# Patient Record
Sex: Male | Born: 1950 | Race: Black or African American | Hispanic: No | Marital: Married | Smoking: Current every day smoker
Health system: Southern US, Community
[De-identification: ages and names within clinical notes are randomized; demographics above are authoritative.]

## PROBLEM LIST (undated history)

## (undated) DIAGNOSIS — I10 Essential (primary) hypertension: Secondary | ICD-10-CM

## (undated) HISTORY — PX: BACK SURGERY: SHX140

---

## 2008-01-30 ENCOUNTER — Ambulatory Visit: Payer: Self-pay | Admitting: Unknown Physician Specialty

## 2008-07-23 ENCOUNTER — Emergency Department: Payer: Self-pay | Admitting: Emergency Medicine

## 2008-08-19 ENCOUNTER — Ambulatory Visit: Payer: Self-pay | Admitting: Unknown Physician Specialty

## 2008-09-17 ENCOUNTER — Ambulatory Visit: Payer: Self-pay | Admitting: Unknown Physician Specialty

## 2008-09-21 ENCOUNTER — Inpatient Hospital Stay: Payer: Self-pay | Admitting: Unknown Physician Specialty

## 2010-09-09 ENCOUNTER — Emergency Department: Payer: Self-pay | Admitting: Emergency Medicine

## 2014-02-22 DIAGNOSIS — M48061 Spinal stenosis, lumbar region without neurogenic claudication: Secondary | ICD-10-CM | POA: Insufficient documentation

## 2014-05-01 ENCOUNTER — Emergency Department: Payer: Self-pay | Admitting: Emergency Medicine

## 2014-05-22 ENCOUNTER — Emergency Department: Payer: Self-pay | Admitting: Emergency Medicine

## 2014-05-22 LAB — CBC
HCT: 45.8 % (ref 40.0–52.0)
HGB: 14.7 g/dL (ref 13.0–18.0)
MCH: 28.1 pg (ref 26.0–34.0)
MCHC: 32 g/dL (ref 32.0–36.0)
MCV: 88 fL (ref 80–100)
Platelet: 134 10*3/uL — ABNORMAL LOW (ref 150–440)
RBC: 5.21 10*6/uL (ref 4.40–5.90)
RDW: 12.8 % (ref 11.5–14.5)
WBC: 5.8 10*3/uL (ref 3.8–10.6)

## 2014-05-22 LAB — BASIC METABOLIC PANEL
ANION GAP: 9 (ref 7–16)
BUN: 12 mg/dL (ref 7–18)
Calcium, Total: 8.1 mg/dL — ABNORMAL LOW (ref 8.5–10.1)
Chloride: 105 mmol/L (ref 98–107)
Co2: 27 mmol/L (ref 21–32)
Creatinine: 1.1 mg/dL (ref 0.60–1.30)
EGFR (African American): >60
EGFR (Non-African Amer.): >60
Glucose: 90 mg/dL (ref 65–99)
Osmolality: 281 (ref 275–301)
POTASSIUM: 3.5 mmol/L (ref 3.5–5.1)
Sodium: 141 mmol/L (ref 136–145)

## 2014-05-22 LAB — TROPONIN I

## 2014-08-23 DIAGNOSIS — H6122 Impacted cerumen, left ear: Secondary | ICD-10-CM | POA: Diagnosis not present

## 2014-08-23 DIAGNOSIS — I10 Essential (primary) hypertension: Secondary | ICD-10-CM | POA: Diagnosis not present

## 2014-08-23 DIAGNOSIS — M4806 Spinal stenosis, lumbar region: Secondary | ICD-10-CM | POA: Diagnosis not present

## 2014-11-03 ENCOUNTER — Other Ambulatory Visit: Payer: Self-pay

## 2014-11-03 DIAGNOSIS — M4716 Other spondylosis with myelopathy, lumbar region: Secondary | ICD-10-CM | POA: Diagnosis not present

## 2014-11-03 DIAGNOSIS — M170 Bilateral primary osteoarthritis of knee: Secondary | ICD-10-CM | POA: Diagnosis not present

## 2014-11-03 NOTE — Patient Outreach (Signed)
[facility]) Care Management  11/03/2014  Nathan James 1950/12/03 [id number]   RN CM Spoke patient about the services of [facility]  Patient states he has a history of high blood pressure.  Patient states he takes his medications as prescribed.  States he see his primary doctor every 6 months. Patient kept his appointment in January with his primary doctor and no changes were made in his medication routine.  States when he picks up his medication each month at the drugstore he has his blood pressure taken.  Patient states he needed help with affording to see a dentist.  States he has some benefits but still needs assistance with affording dental care.  Patient decline the services of [facility].  RN CM will send resource information for residents of [place] care.  Patient has no further nursing needs and RN CM will close this case.  Maury Dus, RN, Ishmael Holter, [place] Telephonic Care Coordinator [phone]

## 2014-11-09 NOTE — Patient Outreach (Signed)
[name]) Care Management  11/09/2014  Nathan James 1950/10/06 [id number]   Received notification from Maury Dus, RN to close case due to patient refused to participate.  Case closed at this time.  Ronnell Freshwater. [place] CM Assistant Phone: [phone] Fax: [phone]

## 2015-02-18 DIAGNOSIS — M4806 Spinal stenosis, lumbar region: Secondary | ICD-10-CM | POA: Diagnosis not present

## 2015-02-18 DIAGNOSIS — Z125 Encounter for screening for malignant neoplasm of prostate: Secondary | ICD-10-CM | POA: Diagnosis not present

## 2015-02-18 DIAGNOSIS — I10 Essential (primary) hypertension: Secondary | ICD-10-CM | POA: Diagnosis not present

## 2015-02-25 DIAGNOSIS — Z0000 Encounter for general adult medical examination without abnormal findings: Secondary | ICD-10-CM | POA: Diagnosis not present

## 2015-02-25 DIAGNOSIS — M4806 Spinal stenosis, lumbar region: Secondary | ICD-10-CM | POA: Diagnosis not present

## 2015-11-02 DIAGNOSIS — M4806 Spinal stenosis, lumbar region: Secondary | ICD-10-CM | POA: Diagnosis not present

## 2015-11-02 DIAGNOSIS — I10 Essential (primary) hypertension: Secondary | ICD-10-CM | POA: Diagnosis not present

## 2016-04-17 ENCOUNTER — Emergency Department
Admission: EM | Admit: 2016-04-17 | Discharge: 2016-04-17 | Disposition: A | Discharge status: Home / Self Care | Payer: Commercial Managed Care - HMO | Attending: Emergency Medicine | Admitting: Emergency Medicine

## 2016-04-17 DIAGNOSIS — H6122 Impacted cerumen, left ear: Secondary | ICD-10-CM | POA: Diagnosis not present

## 2016-04-17 DIAGNOSIS — H938X2 Other specified disorders of left ear: Secondary | ICD-10-CM | POA: Diagnosis present

## 2016-04-17 NOTE — ED Triage Notes (Signed)
Patient ambulatory to triage with steady gait, without difficulty or distress noted; pt reports left ear "stopped up" for last couple weeks; denies pain, denies any recent illness

## 2016-04-17 NOTE — ED Notes (Signed)
Pt stated his left ear feels like cotton is stopped up in it and feels more he is off balance.

## 2016-04-17 NOTE — ED Notes (Signed)
Discharge instructions reviewed with patient. Questions fielded by this RN. Patient verbalizes understanding of instructions. Patient discharged home in stable condition per Bacon PA. No acute distress noted at time of discharge.   

## 2016-04-17 NOTE — ED Provider Notes (Signed)
[facility] Emergency Department Provider Note ____________________________________________  Time seen: 2251  I have reviewed the triage vital signs and the nursing notes.  HISTORY  Chief Complaint  Ear Fullness  HPI Nathan James is a 65 y.o. male sensitive the ED with left ear fullness that lasted days. Patient denies any injury, accident, or trauma. He denies any fevers, chills, sweats or upper extremity symptoms. He also reports some sense of dizziness but denies any ear drainage, vertigo, ringing in the ears, or hearing loss.  No past medical history on file.  There are no active problems to display for this patient.  No past surgical history on file.  Prior to Admission medications   Not on File   Allergies Review of patient's allergies indicates no known allergies.  No family history on file.  Social History Social History  Substance Use Topics  . Smoking status: Not on file  . Smokeless tobacco: Not on file  . Alcohol use Not on file   Review of Systems  Constitutional: Negative for fever. Eyes: Negative for visual changes. ENT: Negative for sore throat.Left ear fullness as above. Cardiovascular: Negative for chest pain. Respiratory: Negative for shortness of breath. Neurological: Negative for headaches, focal weakness or numbness. ____________________________________________  PHYSICAL EXAM:  VITAL SIGNS: ED Triage Vitals [04/17/16 2149]  Enc Vitals Group     BP (!) 157/82     Pulse Rate 78     Resp 18     Temp 98.2 F (36.8 C)     Temp Source Oral     SpO2 94 %     Weight 180 lb (81.6 kg)     Height 6' (1.829 m)     Head Circumference      Peak Flow      Pain Score      Pain Loc      Pain Edu?      Excl. in [place]?    Constitutional: Alert and oriented. Well appearing and in no distress. Head: Normocephalic and atraumatic.      Eyes: Conjunctivae are normal. PERRL. Normal extraocular movements      Ears: Left TM  completely obscurred by a large, soft plug of cerumen in the canal. Right TM intact and canal clear.  Neurologic:  Normal gait without ataxia. Normal speech and language. No gross focal neurologic deficits are appreciated. Skin:  Skin is warm, dry and intact. No rash noted. ____________________________________________  CERUMEN REMOVAL Performed by: Melvenia Needles Authorized by: Melvenia Needles Consent: Verbal consent obtained. Risks and benefits: risks, benefits and alternatives were discussed Consent given by: parent/patient Patient identity confirmed: provided demographic data Prepped and Draped in normal fashion  Ear(s) Involved: left  Irrigation Method: 18G IV cannula + 20 cc syringe  Irrigation Solution: 1:1 mixture of Hydrogen peroxide:water  Resolution: Successful cerumen impaction removal. TM(s) visualized and intact.  Patient tolerance: Patient tolerated the procedure well with no immediate complications. ____________________________________________  INITIAL IMPRESSION / ASSESSMENT AND PLAN / ED COURSE  Patient status post ear wash procedure for a left cerumen impaction. Discharged with instructions to use OTC Debrox as needed.   Clinical Course   ____________________________________________  FINAL CLINICAL IMPRESSION(S) / ED DIAGNOSES  Final diagnoses:  Cerumen impaction, left      Melvenia Needles, PA-C 04/17/16 2336    Nena Polio, MD 04/18/16 0006

## 2016-04-25 DIAGNOSIS — I10 Essential (primary) hypertension: Secondary | ICD-10-CM | POA: Diagnosis not present

## 2016-04-25 DIAGNOSIS — M4806 Spinal stenosis, lumbar region: Secondary | ICD-10-CM | POA: Diagnosis not present

## 2016-04-25 DIAGNOSIS — Z125 Encounter for screening for malignant neoplasm of prostate: Secondary | ICD-10-CM | POA: Diagnosis not present

## 2016-05-02 DIAGNOSIS — Z79899 Other long term (current) drug therapy: Secondary | ICD-10-CM | POA: Diagnosis not present

## 2016-05-02 DIAGNOSIS — Z0000 Encounter for general adult medical examination without abnormal findings: Secondary | ICD-10-CM | POA: Diagnosis not present

## 2016-10-24 DIAGNOSIS — Z79899 Other long term (current) drug therapy: Secondary | ICD-10-CM | POA: Diagnosis not present

## 2016-10-24 DIAGNOSIS — Z0000 Encounter for general adult medical examination without abnormal findings: Secondary | ICD-10-CM | POA: Diagnosis not present

## 2016-10-24 DIAGNOSIS — I10 Essential (primary) hypertension: Secondary | ICD-10-CM | POA: Diagnosis not present

## 2016-10-31 DIAGNOSIS — Z0000 Encounter for general adult medical examination without abnormal findings: Secondary | ICD-10-CM | POA: Insufficient documentation

## 2016-10-31 DIAGNOSIS — I10 Essential (primary) hypertension: Secondary | ICD-10-CM | POA: Insufficient documentation

## 2016-10-31 DIAGNOSIS — M48062 Spinal stenosis, lumbar region with neurogenic claudication: Secondary | ICD-10-CM | POA: Diagnosis not present

## 2017-04-30 DIAGNOSIS — Z79899 Other long term (current) drug therapy: Secondary | ICD-10-CM | POA: Diagnosis not present

## 2017-04-30 DIAGNOSIS — Z125 Encounter for screening for malignant neoplasm of prostate: Secondary | ICD-10-CM | POA: Diagnosis not present

## 2017-04-30 DIAGNOSIS — M48062 Spinal stenosis, lumbar region with neurogenic claudication: Secondary | ICD-10-CM | POA: Diagnosis not present

## 2017-05-07 DIAGNOSIS — M48062 Spinal stenosis, lumbar region with neurogenic claudication: Secondary | ICD-10-CM | POA: Diagnosis not present

## 2017-05-07 DIAGNOSIS — Z720 Tobacco use: Secondary | ICD-10-CM | POA: Insufficient documentation

## 2017-05-07 DIAGNOSIS — Z0000 Encounter for general adult medical examination without abnormal findings: Secondary | ICD-10-CM | POA: Diagnosis not present

## 2017-05-07 DIAGNOSIS — R972 Elevated prostate specific antigen [PSA]: Secondary | ICD-10-CM | POA: Diagnosis not present

## 2017-08-09 DIAGNOSIS — H524 Presbyopia: Secondary | ICD-10-CM | POA: Diagnosis not present

## 2017-08-09 DIAGNOSIS — H25813 Combined forms of age-related cataract, bilateral: Secondary | ICD-10-CM | POA: Diagnosis not present

## 2017-10-30 DIAGNOSIS — R972 Elevated prostate specific antigen [PSA]: Secondary | ICD-10-CM | POA: Diagnosis not present

## 2017-10-30 DIAGNOSIS — M48062 Spinal stenosis, lumbar region with neurogenic claudication: Secondary | ICD-10-CM | POA: Diagnosis not present

## 2017-11-06 DIAGNOSIS — R972 Elevated prostate specific antigen [PSA]: Secondary | ICD-10-CM | POA: Insufficient documentation

## 2017-11-06 DIAGNOSIS — Z0000 Encounter for general adult medical examination without abnormal findings: Secondary | ICD-10-CM | POA: Diagnosis not present

## 2017-11-06 DIAGNOSIS — Z125 Encounter for screening for malignant neoplasm of prostate: Secondary | ICD-10-CM | POA: Diagnosis not present

## 2017-11-06 DIAGNOSIS — M48062 Spinal stenosis, lumbar region with neurogenic claudication: Secondary | ICD-10-CM | POA: Diagnosis not present

## 2017-11-06 DIAGNOSIS — Z79899 Other long term (current) drug therapy: Secondary | ICD-10-CM | POA: Diagnosis not present

## 2017-12-21 ENCOUNTER — Encounter: Payer: Self-pay | Admitting: Emergency Medicine

## 2017-12-21 ENCOUNTER — Emergency Department: Payer: Medicare HMO

## 2017-12-21 ENCOUNTER — Other Ambulatory Visit: Payer: Self-pay

## 2017-12-21 ENCOUNTER — Emergency Department
Admission: EM | Admit: 2017-12-21 | Discharge: 2017-12-21 | Disposition: A | Discharge status: Home / Self Care | Payer: Medicare HMO | Attending: Emergency Medicine | Admitting: Emergency Medicine

## 2017-12-21 DIAGNOSIS — F17200 Nicotine dependence, unspecified, uncomplicated: Secondary | ICD-10-CM | POA: Diagnosis not present

## 2017-12-21 DIAGNOSIS — R05 Cough: Secondary | ICD-10-CM | POA: Insufficient documentation

## 2017-12-21 DIAGNOSIS — I10 Essential (primary) hypertension: Secondary | ICD-10-CM | POA: Insufficient documentation

## 2017-12-21 DIAGNOSIS — J209 Acute bronchitis, unspecified: Secondary | ICD-10-CM | POA: Insufficient documentation

## 2017-12-21 DIAGNOSIS — R0602 Shortness of breath: Secondary | ICD-10-CM | POA: Diagnosis not present

## 2017-12-21 HISTORY — DX: Essential (primary) hypertension: I10

## 2017-12-21 LAB — BASIC METABOLIC PANEL
Anion gap: 9 (ref 5–15)
BUN: 13 mg/dL (ref 6–20)
CALCIUM: 8.8 mg/dL — AB (ref 8.9–10.3)
CO2: 24 mmol/L (ref 22–32)
CREATININE: 1.1 mg/dL (ref 0.61–1.24)
Chloride: 103 mmol/L (ref 101–111)
GFR calc non Af Amer: >60 mL/min (ref >60)
GLUCOSE: 144 mg/dL — AB (ref 65–99)
Potassium: 3.1 mmol/L — ABNORMAL LOW (ref 3.5–5.1)
Sodium: 136 mmol/L (ref 135–145)

## 2017-12-21 LAB — CBC
HCT: 42.2 % (ref 40.0–52.0)
Hemoglobin: 14.2 g/dL (ref 13.0–18.0)
MCH: 28.3 pg (ref 26.0–34.0)
MCHC: 33.6 g/dL (ref 32.0–36.0)
MCV: 84.2 fL (ref 80.0–100.0)
Platelets: 128 10*3/uL — ABNORMAL LOW (ref 150–440)
RBC: 5.01 MIL/uL (ref 4.40–5.90)
RDW: 13.5 % (ref 11.5–14.5)
WBC: 5.3 10*3/uL (ref 3.8–10.6)

## 2017-12-21 LAB — TROPONIN I: Troponin I: <0.03 ng/mL (ref <0.03)

## 2017-12-21 MED ORDER — IPRATROPIUM-ALBUTEROL 0.5-2.5 (3) MG/3ML IN SOLN
3.0000 mL | Freq: Once | RESPIRATORY_TRACT | Status: AC
  Administered 2017-12-21: 3 mL via RESPIRATORY_TRACT
  Filled 2017-12-21: qty 3

## 2017-12-21 MED ORDER — PREDNISONE 20 MG PO TABS: 60.0000 mg | ORAL_TABLET | Freq: Every day | ORAL | 0 refills | Status: AC

## 2017-12-21 MED ORDER — PREDNISONE 20 MG PO TABS
60.0000 mg | ORAL_TABLET | Freq: Once | ORAL | Status: AC
  Administered 2017-12-21: 60 mg via ORAL
  Filled 2017-12-21: qty 3

## 2017-12-21 MED ORDER — ALBUTEROL SULFATE HFA 108 (90 BASE) MCG/ACT IN AERS: 2.0000 | INHALATION_SPRAY | Freq: Four times a day (QID) | RESPIRATORY_TRACT | 0 refills | Status: DC | PRN

## 2017-12-21 NOTE — Discharge Instructions (Addendum)
Take the prednisone as prescribed starting tomorrow, and continue it for 4 days.  You should use the albuterol inhaler every 4-6 hours for the next several days as needed.    Return to the ER for new, worsening, persistent severe difficulty breathing, chest pain, weakness or lightheadedness, fevers, or any other new or worsening symptoms that concern you.

## 2017-12-21 NOTE — ED Notes (Signed)
Patient in no apparent distress and is sitting on the side of the bed eating a biscuit.

## 2017-12-21 NOTE — ED Provider Notes (Signed)
[facility] Emergency Department Provider Note ____________________________________________   First MD Initiated Contact with Patient 12/21/17 1754     (approximate)  I have reviewed the triage vital signs and the nursing notes.   HISTORY  Chief Complaint Shortness of Breath    HPI Nathan James is a 67 y.o. male with PMH as noted below who presents with cough over the last 4 days, persistent coarse, nonproductive, and associated with mild shortness of breath.  No associated fever or chest pain.  Patient states that the symptoms began after he was working in his yard and was using an insect spray without wearing a mask.  He denies previous history of lung problems.  Past Medical History:  Diagnosis Date  . Hypertension     There are no active problems to display for this patient.   Past Surgical History:  Procedure Laterality Date  . BACK SURGERY      Prior to Admission medications   Medication Sig Start Date End Date Taking? Authorizing Provider  albuterol (PROVENTIL HFA;VENTOLIN HFA) 108 (90 Base) MCG/ACT inhaler Inhale 2 puffs into the lungs every 6 (six) hours as needed for up to 5 days for wheezing or shortness of breath. 12/21/17 12/26/17  Arta Silence, MD  predniSONE (DELTASONE) 20 MG tablet Take 3 tablets (60 mg total) by mouth daily with breakfast for 4 days. 12/22/17 12/26/17  Arta Silence, MD    Allergies Patient has no known allergies.  History reviewed. No pertinent family history.  Social History Social History   Tobacco Use  . Smoking status: Current Every Day Smoker  . Smokeless tobacco: Never Used  Substance Use Topics  . Alcohol use: Yes  . Drug use: Never    Review of Systems  Constitutional: No fever. Eyes: No redness. ENT: No sore throat. Cardiovascular: Denies chest pain. Respiratory: Positive for shortness of breath. Gastrointestinal: No vomiting.  No diarrhea.  Genitourinary: Negative for dysuria.   Musculoskeletal: Negative for back pain. Skin: Negative for rash. Neurological: Negative for headache.   ____________________________________________   PHYSICAL EXAM:  VITAL SIGNS: ED Triage Vitals  Enc Vitals Group     BP 12/21/17 1515 (!) 147/100     Pulse Rate 12/21/17 1515 71     Resp 12/21/17 1515 16     Temp 12/21/17 1515 98.1 F (36.7 C)     Temp Source 12/21/17 1515 Oral     SpO2 12/21/17 1515 95 %     Weight 12/21/17 1751 182 lb (82.6 kg)     Height 12/21/17 1751 6' (1.829 m)     Head Circumference --      Peak Flow --      Pain Score 12/21/17 1514 0     Pain Loc --      Pain Edu? --      Excl. in [place]? --     Constitutional: Alert and oriented. Well appearing and in no acute distress. Eyes: Conjunctivae are normal.  Head: Atraumatic. Nose: No congestion/rhinnorhea. Mouth/Throat: Mucous membranes are moist.   Neck: Normal range of motion.  Cardiovascular: Normal rate, regular rhythm. Grossly normal heart sounds.  Good peripheral circulation. Respiratory: Normal respiratory effort.  No retractions. Lungs CTAB.  Slightly decreased breath sounds and prolonged expiratory phase. Gastrointestinal: No distention.  Musculoskeletal: No lower extremity edema.  Extremities warm and well perfused.  Neurologic:  Normal speech and language. No gross focal neurologic deficits are appreciated.  Skin:  Skin is warm and dry. No rash noted.  Psychiatric: Mood and affect are normal. Speech and behavior are normal.  ____________________________________________   LABS (all labs ordered are listed, but only abnormal results are displayed)  Labs Reviewed  BASIC METABOLIC PANEL - Abnormal; Notable for the following components:      Result Value   Potassium 3.1 (*)    Glucose, Bld 144 (*)    Calcium 8.8 (*)    All other components within normal limits  CBC - Abnormal; Notable for the following components:   Platelets 128 (*)    All other components within normal limits    TROPONIN I   ____________________________________________  EKG  ED ECG REPORT I, Arta Silence, the attending physician, personally viewed and interpreted this ECG.  Date: 12/21/2017 EKG Time: 1517 Rate: 65 Rhythm: normal sinus rhythm QRS Axis: normal Intervals: Incomplete RBBB ST/T Wave abnormalities: Nonspecific anterior abnormalities Narrative Interpretation: no evidence of acute ischemia; no significant change when compared to EKG of 05/22/2014  ____________________________________________  RADIOLOGY  CXR: No focal infiltrate or other acute findings  ____________________________________________   PROCEDURES  Procedure(s) performed: No  Procedures  Critical Care performed: No ____________________________________________   INITIAL IMPRESSION / ASSESSMENT AND PLAN / ED COURSE  Pertinent labs & imaging results that were available during my care of the patient were reviewed by me and considered in my medical decision making (see chart for details).  67 year old male with history of hypertension presents with cough and shortness of breath over the last several days after spraying his yard without using a mask.  No prior history of similar symptoms, no sick contacts.  On exam, the patient is well-appearing, vital signs are normal except for hypertension, and the O2 saturation is in the mid to high 90s on room air.  No respiratory distress.  Differential includes acute bronchitis, likely viral, versus mild bronchitis/pneumonitis related to irritation from the insect spray.  Chest x-ray shows no infiltrate or findings consistent with pneumonia.  Lab work-up is unremarkable.  Plan: Trial of nebs, steroid, and reassess.  ----------------------------------------- 7:56 PM on 12/21/2017 -----------------------------------------  Patient reports improved symptoms after nebulizer treatment.  He feels well and would like to go home.  No indication for repeat troponin  given that he has had no chest pain or acute EKG changes.  He is stable for discharge home.  We will give prescription for prednisone and albuterol inhaler.  I discussed the results of the work-up with the patient and his wife.  Return precautions given, and he expresses understanding.      ____________________________________________   FINAL CLINICAL IMPRESSION(S) / ED DIAGNOSES  Final diagnoses:  Acute bronchitis, unspecified organism      NEW MEDICATIONS STARTED DURING THIS VISIT:  Discharge Medication List as of 12/21/2017  7:17 PM    START taking these medications   Details  albuterol (PROVENTIL HFA;VENTOLIN HFA) 108 (90 Base) MCG/ACT inhaler Inhale 2 puffs into the lungs every 6 (six) hours as needed for up to 5 days for wheezing or shortness of breath., Starting Sat 12/21/2017, Until Thu 12/26/2017, Print    predniSONE (DELTASONE) 20 MG tablet Take 3 tablets (60 mg total) by mouth daily with breakfast for 4 days., Starting Sun 12/22/2017, Until Thu 12/26/2017, Print         Note:  This document was prepared using Dragon voice recognition software and may include unintentional dictation errors.    Arta Silence, MD 12/21/17 [phone]

## 2017-12-21 NOTE — ED Triage Notes (Signed)
Pt c/o SHOB X 1 week with cough. Denies fevers or any pain. Unlabored in triage with no increased WOB.  VSS.

## 2018-05-02 DIAGNOSIS — M48062 Spinal stenosis, lumbar region with neurogenic claudication: Secondary | ICD-10-CM | POA: Diagnosis not present

## 2018-05-02 DIAGNOSIS — Z79899 Other long term (current) drug therapy: Secondary | ICD-10-CM | POA: Diagnosis not present

## 2018-05-02 DIAGNOSIS — Z125 Encounter for screening for malignant neoplasm of prostate: Secondary | ICD-10-CM | POA: Diagnosis not present

## 2018-05-09 ENCOUNTER — Telehealth: Payer: Self-pay

## 2018-05-09 ENCOUNTER — Other Ambulatory Visit: Payer: Self-pay

## 2018-05-09 ENCOUNTER — Encounter: Payer: Self-pay | Admitting: Urology

## 2018-05-09 ENCOUNTER — Ambulatory Visit: Payer: Medicare HMO | Admitting: Urology

## 2018-05-09 VITALS — BP 144/89 | HR 67 | Ht 71.0 in | Wt 180.5 lb

## 2018-05-09 DIAGNOSIS — R972 Elevated prostate specific antigen [PSA]: Secondary | ICD-10-CM | POA: Diagnosis not present

## 2018-05-09 DIAGNOSIS — Z0000 Encounter for general adult medical examination without abnormal findings: Secondary | ICD-10-CM | POA: Diagnosis not present

## 2018-05-09 DIAGNOSIS — Z79899 Other long term (current) drug therapy: Secondary | ICD-10-CM | POA: Diagnosis not present

## 2018-05-09 DIAGNOSIS — M48061 Spinal stenosis, lumbar region without neurogenic claudication: Secondary | ICD-10-CM | POA: Diagnosis not present

## 2018-05-09 DIAGNOSIS — J40 Bronchitis, not specified as acute or chronic: Secondary | ICD-10-CM | POA: Diagnosis not present

## 2018-05-09 NOTE — Telephone Encounter (Signed)
Faxed cardiac clearance for pt to stop Aspirin 7 days prior to prostate biopsy. Scheduled for 06/16/18. This was sent to pt pcp Emily Filbert at [facility].

## 2018-05-09 NOTE — Progress Notes (Signed)
   05/09/2018 4:18 PM   Arnette Schaumann 1951/07/11 [id number]  Referring provider: Rusty Aus, MD [place], [place] [address]  CC: Elevated PSA  HPI: I had the pleasure of seeing Nathan James in urology clinic today for elevated PSA.  He is a 67 year old African-American male who denies family history of prostate cancer who was found to have a PSA of 6.73 in September 2019.  He does have a history of elevated PSA, it was 5.8 in March 2019, and 5.5 in September 2018.  He has never been evaluated by urologist or undergone prostate biopsy.  He denies any gross hematuria or urinary symptoms.  He has chronic back pain.  There are no aggravating or alleviating factors.  Duration is 1 year.  Severity is moderate.   PMH: Past Medical History:  Diagnosis Date  . Hypertension     Surgical History: Past Surgical History:  Procedure Laterality Date  . BACK SURGERY      Allergies:  Allergies  Allergen Reactions  . Atenolol Other (See Comments)    Questionable.    Family History: No family history on file.  Social History:  reports that he has been smoking. He has never used smokeless tobacco. He reports that he drinks alcohol. He reports that he does not use drugs.  ROS: Please see flowsheet from today's date for complete review of systems.  Physical Exam: BP (!) 144/89 (BP Location: Left Arm, Patient Position: Sitting, Cuff Size: Normal)   Pulse 67   Ht 5\' 11"  (1.803 m)   Wt 180 lb 8 oz (81.9 kg)   BMI 25.17 kg/m    Constitutional:  Alert and oriented, No acute distress. Cardiovascular: No clubbing, cyanosis, or edema. Respiratory: Normal respiratory effort, no increased work of breathing. GI: Abdomen is soft, nontender, nondistended, no abdominal masses GU: No CVA tenderness DRE: Deferred until time of biopsy Lymph: No cervical or inguinal lymphadenopathy. Skin: No rashes, bruises or suspicious lesions. Neurologic:  Grossly intact, no focal deficits, moving all 4 extremities. Psychiatric: Normal mood and affect.  Laboratory Data: PSA 04/2018: 6.73 10/2017: 5.77 04/2017: 5.5 04/2016: 3.8 02/2015: 3.44 01/2014: 3.02  Assessment & Plan:   In summary, Nathan James is a 67 year old African-American male with no family history of prostate cancer who presents with persistently elevated PSA to 6.73.  We reviewed the implications of an elevated PSA and the uncertainty surrounding it. In general, a man's PSA increases with age and is produced by both normal and cancerous prostate tissue. The differential diagnosis for elevated PSA includes BPH, prostate cancer, infection, recent intercourse/ejaculation, recent urethroscopic manipulation (foley placement/cystoscopy) or trauma, and prostatitis.   Management of an elevated PSA can include observation or prostate biopsy and we discussed this in detail. Our goal is to detect clinically significant prostate cancers, and manage with either active surveillance, surgery, or radiation for localized disease. Risks of prostate biopsy include bleeding, infection (including life threatening sepsis), pain, and lower urinary symptoms. Hematuria, hematospermia, and blood in the stool are all common after biopsy and can persist up to 4 weeks.   Return for schedule prostate biopsy.  Billey Co, [facility] [address], [place], [place] [address] [phone]

## 2018-05-09 NOTE — Patient Instructions (Signed)
Transrectal Ultrasound-Guided Biopsy A transrectal ultrasound-guided biopsy is a procedure to take samples of tissue from your prostate. Ultrasound images are used to guide the procedure. It is usually done to check the prostate gland for cancer. What happens before the procedure?  Do not eat or drink after midnight on the night before your procedure.  Take medicines as your doctor tells you.  Your doctor may have you stop taking some medicines 5-7 days before the procedure.  You will be given an enema before your procedure. During an enema, a liquid is put into your butt (rectum) to clear out waste.  You may have lab tests the day of your procedure.  Make plans to have someone drive you home. What happens during the procedure?  You will be given medicine to help you relax before the procedure. An IV tube will be put into one of your veins. It will be used to give fluids and medicine.  You will be given medicine to reduce the risk of infection (antibiotic).  You will be placed on your side.  A probe with gel will be put in your butt. This is used to take pictures of your prostate and the area around it.  A medicine to numb the area is put into your prostate.  A biopsy needle is then inserted and guided to your prostate.  Samples of prostate tissue are taken. The needle is removed.  The samples are sent to a lab to be checked. Results are usually back in 2-3 days. What happens after the procedure?  You will be taken to a room where you will be watched until you are doing okay.  You may have some pain in the area around your butt. You will be given medicines for this.  You may be able to go home the same day. Sometimes, an overnight stay in the hospital is needed. This information is not intended to replace advice given to you by your health care provider. Make sure you discuss any questions you have with your health care provider. Document Released: 07/11/2009 Document Revised:  12/29/2015 Document Reviewed: 03/11/2013 Elsevier Interactive Patient Education  2018 Elsevier Inc.  

## 2018-06-16 ENCOUNTER — Other Ambulatory Visit: Payer: Self-pay | Admitting: Urology

## 2018-06-16 ENCOUNTER — Encounter: Payer: Self-pay | Admitting: Urology

## 2018-06-16 ENCOUNTER — Ambulatory Visit (INDEPENDENT_AMBULATORY_CARE_PROVIDER_SITE_OTHER): Payer: Medicare HMO | Admitting: Urology

## 2018-06-16 ENCOUNTER — Other Ambulatory Visit: Payer: Self-pay

## 2018-06-16 VITALS — BP 170/86 | HR 90 | Ht 71.0 in | Wt 180.0 lb

## 2018-06-16 DIAGNOSIS — N4289 Other specified disorders of prostate: Secondary | ICD-10-CM | POA: Diagnosis not present

## 2018-06-16 DIAGNOSIS — N4231 Prostatic intraepithelial neoplasia: Secondary | ICD-10-CM | POA: Diagnosis not present

## 2018-06-16 DIAGNOSIS — R972 Elevated prostate specific antigen [PSA]: Secondary | ICD-10-CM | POA: Diagnosis not present

## 2018-06-16 DIAGNOSIS — C61 Malignant neoplasm of prostate: Secondary | ICD-10-CM | POA: Diagnosis not present

## 2018-06-16 MED ORDER — GENTAMICIN SULFATE 40 MG/ML IJ SOLN
80.0000 mg | Freq: Once | INTRAMUSCULAR | Status: AC
  Administered 2018-06-16: 80 mg via INTRAMUSCULAR

## 2018-06-16 MED ORDER — LEVOFLOXACIN 500 MG PO TABS
500.0000 mg | ORAL_TABLET | Freq: Once | ORAL | Status: AC
  Administered 2018-06-16: 500 mg via ORAL

## 2018-06-16 NOTE — Progress Notes (Signed)
   06/16/18  Indication: Elevated PSA, 6.73  Prostate Biopsy Procedure   Informed consent was obtained, and we discussed the risks of bleeding and infection/sepsis. A time out was performed to ensure correct patient identity.  Pre-Procedure: - Last PSA Level: No results found for: PSA - Gentamicin and levaquin given for antibiotic prophylaxis - Transrectal Ultrasound performed revealing a 43 gm prostate - No significant hypoechoic or median lobe noted  Procedure: - Prostate block performed using 10 cc 1% lidocaine and biopsies taken from sextant areas, a total of 12 under ultrasound guidance.  Post-Procedure: - Patient tolerated the procedure well - He was counseled to seek immediate medical attention if experiences significant bleeding, fevers, or severe pain - Return in one week to discuss biopsy results  Assessment/ Plan: Will follow up in 1-2 weeks to discuss pathology  Nickolas Madrid, MD 06/16/2018

## 2018-06-19 LAB — PATHOLOGY REPORT

## 2018-06-20 ENCOUNTER — Other Ambulatory Visit: Payer: Self-pay | Admitting: Urology

## 2018-07-01 ENCOUNTER — Other Ambulatory Visit: Payer: Self-pay

## 2018-07-01 ENCOUNTER — Encounter: Payer: Self-pay | Admitting: Urology

## 2018-07-01 ENCOUNTER — Ambulatory Visit (INDEPENDENT_AMBULATORY_CARE_PROVIDER_SITE_OTHER): Payer: Medicare HMO | Admitting: Urology

## 2018-07-01 VITALS — BP 146/81 | HR 65 | Wt 179.0 lb

## 2018-07-01 DIAGNOSIS — C61 Malignant neoplasm of prostate: Secondary | ICD-10-CM

## 2018-07-01 MED ORDER — SILDENAFIL CITRATE 20 MG PO TABS: 20.0000 mg | ORAL_TABLET | ORAL | 11 refills | Status: DC | PRN

## 2018-07-01 NOTE — Progress Notes (Addendum)
    Reason for visit: Discuss prostate biopsy results  Nathan James is a 67 year old male who underwent prostate biopsy for elevated PSA of 6.7.  This showed a 43 g gland with high risk Gleason score 4+5 = 9 disease.  He had 4+5 = 9 prostate cancer in 1 out of 1 core from the right base, as well as 4+4 = 8 prostate cancer from the right apex.  There is also some 4+3 equal 7 disease on the right base, mid, and apex.  Max core involvement was 70%.  He denies any prior abdominal surgeries.  He has chronic back pain secondary to multiple plates in his back.  He is disabled secondary to his back pain.  He does not get erections, and has never tried medications for this previously.  He denies any significant urinary symptoms.  We had a lengthy conversation today about the patient's new diagnosis of prostate cancer.  We reviewed the risk classifications per the AUA guidelines including very low risk, low risk, intermediate risk, and high risk disease, and the need for additional staging imaging with CT and bone scan in patients with unfavorable intermediate risk and high risk disease.  I explained that his life expectancy, clinical stage, Gleason score, PSA, and other co-morbidities influence treatment strategies.  We discussed the roles of active surveillance, radiation therapy, surgical therapy with robotic prostatectomy, and hormone therapy with androgen deprivation.  We discussed that patients urinary symptoms also impact treatment strategy, as patients with severe lower urinary tract symptoms may have significant worsening or even develop urinary retention after undergoing radiation.  In regards to surgery, we discussed robotic prostatectomy +/- lymphadenectomy at length.  The procedure takes 3 to 4 hours, and patient's typically discharge home on post-op day #1.  A Foley catheter is left in place for 7 to 10 days to allow for healing of the vesicourethral anastomosis.  There is a small risk of bleeding,  infection, damage to surrounding structures or bowel, hernia, DVT/PE, or serious cardiac or pulmonary complications.  We discussed at length post-op side effects including erectile dysfunction, and the importance of pre-operative erectile function on long-term outcomes.  Even with a nerve sparing approach, there is an approximately 25% rate of permanent erectile dysfunction.  We also discussed postop urinary incontinence at length.  We expect patients to have stress incontinence post-operatively that will improve over period of weeks to months.  Less than 10% of men will require a pad at 1 year after surgery.  Patients will need to avoid heavy lifting and strenuous activity for 3 to 4 weeks, but most men return to their baseline activity status by 6 weeks.  Staging imaging with CT abdomen pelvis with contrast and bone scan, will call with results.  We discussed at length that the next steps will be based on his staging imaging.  If he has metastatic disease we will refer him to medical oncology for systemic treatment.  If he does have localized prostate cancer, we we will discuss further the differences between radiation surgery, the patient is unsure at this time.  Total of 25 minutes were spent with the patient today face-to-face, greater than 50% were spent in direct patient education and counseling regarding new diagnosis of high risk prostate cancer, need for staging imaging, and the differences between systemic therapy, radiation, and surgery.  Nickolas Madrid, MD 07/01/2018

## 2018-07-01 NOTE — Patient Instructions (Signed)
Prostate Cancer The prostate is a walnut-sized gland that is involved in the production of semen. It is located below a man's bladder, in front of the rectum. Prostate cancer is the abnormal growth of cells in the prostate gland. What are the causes? The exact cause of this condition is not known. What increases the risk? This condition is more likely to develop in men who:  Are older than age 68.  Are African-American.  Are obese.  Have a family history of prostate cancer.  Have a family history of breast cancer.  What are the signs or symptoms? Symptoms of this condition include:  A need to urinate often.  Weak or interrupted flow of urine.  Trouble starting or stopping urination.  Inability to urinate.  Pain or burning during urination.  Painful ejaculation.  Blood in urine or semen.  Persistent pain or discomfort in the lower back, lower abdomen, hips, or upper thighs.  Trouble getting an erection.  Trouble emptying the bladder all the way.  How is this diagnosed? This condition can be diagnosed with:  A digital rectal exam. For this exam, a health care provider inserts a gloved finger into the rectum to feel the prostate gland.  A blood test called a prostate-specific antigen (PSA) test.  An imaging test called transrectal ultrasonography.  A procedure in which a sample of tissue is taken from the prostate and examined under a microscope (prostate biopsy).  Once the condition is diagnosed, tests will be done to determine how far the cancer has spread. This is called staging the cancer. Staging may involve imaging tests, such as:  A bone scan.  A CT scan.  A PET scan.  An MRI.  The stages of prostate cancer are as follows:  Stage I. At this stage, the cancer is found in the prostate only. The cancer is not visible on imaging tests and it is usually found by accident, such as during a prostate surgery.  Stage II. At this stage, the cancer is more  advanced than it is in stage I, but the cancer has not spread outside the prostate.  Stage III. At this stage, the cancer has spread beyond the outer layer of the prostate to nearby tissues. The cancer may be found in the seminal vesicles, which are near the bladder and the prostate.  Stage IV. At this stage, the cancer has spread other parts of the body, such as the lymph nodes, bones, bladder, rectum, liver, or lungs.  How is this treated? Treatment for this condition depends on several factors, including the stage of the cancer, your age, personal preferences, and your overall health. Talk with your health care provider about treatment options that are recommended for you. Common treatments include:  Observation for early stage prostate cancer (active surveillance). This involves having exams, blood tests, and in some cases, more biopsies. For some men, this is the only treatment needed.  Surgery. Types of surgeries include: ? Open surgery. In this surgery, a larger incision is made to remove the prostate. ? A laparoscopic prostatectomy. This is a surgery to remove the prostate and lymph nodes through several, small incisions. It is often referred to as a minimally invasive surgery. ? A robotic prostatectomy. This is a surgery to remove the prostate and lymph nodes with the help of a robotic arm that is controlled by a computer. ? Orchiectomy. This is a surgery to remove the testicles. ? Cryosurgery. This is a surgery to freeze and destroy cancer cells.    Radiation treatment. Types of radiation treatment include: ? External beam radiation. This type aims beams of radiation from outside the body at the prostate to destroy cancerous cells. ? Brachytherapy. This type uses radioactive needles, seeds, wires, or tubes that are implanted into the prostate gland. Like external beam radiation, brachytherapy destroys cancerous cells. An advantage is that this type of radiation limits the damage to  surrounding tissue and has fewer side effects.  High-intensity, focused ultrasonography. This treatment destroys cancer cells by delivering high-energy ultrasound waves to the cancerous cells.  Chemotherapy medicines. This treatment kills cancer cells or stops them from multiplying.  Hormone treatment. This treatment involves taking medicines that act on one of the male hormones (testosterone): ? By stopping your body from producing testosterone. ? By blocking testosterone from reaching cancer cells.  Follow these instructions at home:  Take over-the-counter and prescription medicines only as told by your health care provider.  Maintain a healthy diet.  Get plenty of sleep.  Consider joining a support group for men who have prostate cancer. Meeting with a support group may help you learn to cope with the stress of having cancer.  Keep all follow-up visits as told by your health care provider. This is important.  If you have to go to the hospital, notify your cancer specialist (oncologist).  Treatment for prostate cancer may affect sexual function. Continue to have intimate moments with your partner. This may include touching, holding, hugging, and caressing. Contact a health care provider if:  You have trouble urinating.  You have blood in your urine.  You have pain in your hips, back, or chest. Get help right away if:  You have weakness or numbness in your legs.  You have cannot control urination or your bowel movements (incontinence).  You have trouble breathing.  You have sudden chest pain.  You have chills or a fever. Summary  The prostate is a walnut-sized gland that is involved in the production of semen. It is located below a man's bladder, in front of the rectum. Prostate cancer is the abnormal growth of cells in the prostate gland.  Treatment for this condition depends on several factors, including the stage of the cancer, your age, personal preferences, and  your overall health. Talk with your health care provider about treatment options that are recommended for you.  Consider joining a support group for men who have prostate cancer. Meeting with a support group may help you learn to cope with the stress of having cancer. This information is not intended to replace advice given to you by your health care provider. Make sure you discuss any questions you have with your health care provider. Document Released: 07/23/2005 Document Revised: 04/03/2016 Document Reviewed: 04/02/2016 Elsevier Interactive Patient Education  2017 [employer]  

## 2018-07-25 ENCOUNTER — Encounter
Admission: RE | Admit: 2018-07-25 | Discharge: 2018-07-25 | Disposition: A | Discharge status: Home / Self Care | Payer: Medicare HMO | Source: Ambulatory Visit | Attending: Urology | Admitting: Urology

## 2018-07-25 ENCOUNTER — Ambulatory Visit
Admission: RE | Admit: 2018-07-25 | Discharge: 2018-07-25 | Disposition: A | Discharge status: Home / Self Care | Payer: Medicare HMO | Source: Ambulatory Visit | Attending: Urology | Admitting: Urology

## 2018-07-25 DIAGNOSIS — C61 Malignant neoplasm of prostate: Secondary | ICD-10-CM | POA: Insufficient documentation

## 2018-07-25 MED ORDER — TECHNETIUM TC 99M MEDRONATE IV KIT
20.0000 | PACK | Freq: Once | INTRAVENOUS | Status: AC | PRN
  Administered 2018-07-25: 23.7 via INTRAVENOUS

## 2018-07-31 ENCOUNTER — Ambulatory Visit
Admission: RE | Admit: 2018-07-31 | Discharge: 2018-07-31 | Disposition: A | Discharge status: Home / Self Care | Payer: Medicare HMO | Source: Ambulatory Visit | Attending: Urology | Admitting: Urology

## 2018-07-31 DIAGNOSIS — C61 Malignant neoplasm of prostate: Secondary | ICD-10-CM | POA: Insufficient documentation

## 2018-07-31 LAB — POCT I-STAT CREATININE: Creatinine, Ser: 0.9 mg/dL (ref 0.61–1.24)

## 2018-07-31 MED ORDER — IOPAMIDOL (ISOVUE-300) INJECTION 61%
100.0000 mL | Freq: Once | INTRAVENOUS | Status: AC | PRN
  Administered 2018-07-31: 100 mL via INTRAVENOUS

## 2018-08-04 ENCOUNTER — Telehealth: Payer: Self-pay | Admitting: Urology

## 2018-08-04 NOTE — Telephone Encounter (Signed)
UROLOGY TELEPHONE NOTE  67 year old African-American with chronic back pain status post multiple back surgeries and hardware on chronic narcotics and disabled with high risk prostate cancer.  Staging imaging with CT abdomen pelvis and bone scan negative for metastatic disease.  We discussed the differences between radiation with hormone therapy versus surgery at length.  He would like to proceed with radiation and hormone therapy.  Risks and benefits discussed.  Referral placed to radiation oncology Follow-up in urology next available for Lupron injection(2-year duration)  Nickolas Madrid, MD 08/04/2018

## 2018-08-04 NOTE — Addendum Note (Signed)
Addended by: Billey Co on: 08/04/2018 04:17 PM   Modules accepted: Orders

## 2018-08-04 NOTE — Telephone Encounter (Signed)
-----   Message from Billey Co, MD sent at 08/04/2018  4:13 PM EST ----- Regarding: radonc referral, follow up visit Please set up referral to RadOnc for high risk prostate cancer asap, as well as nurse visit with [place] asap for lupron injection. Lupron injections should be every 6 months for 2 years.  Nickolas Madrid, MD 08/04/2018

## 2018-08-04 NOTE — Telephone Encounter (Signed)
App made and patient is aware ° °Nathan James °

## 2018-08-06 DIAGNOSIS — C61 Malignant neoplasm of prostate: Secondary | ICD-10-CM

## 2018-08-06 HISTORY — DX: Malignant neoplasm of prostate: C61

## 2018-08-07 ENCOUNTER — Ambulatory Visit (INDEPENDENT_AMBULATORY_CARE_PROVIDER_SITE_OTHER): Payer: Medicare HMO | Admitting: Family Medicine

## 2018-08-07 DIAGNOSIS — C61 Malignant neoplasm of prostate: Secondary | ICD-10-CM | POA: Diagnosis not present

## 2018-08-07 MED ORDER — LEUPROLIDE ACETATE (6 MONTH) 45 MG IM KIT
45.0000 mg | PACK | Freq: Once | INTRAMUSCULAR | Status: AC
  Administered 2018-08-07: 45 mg via INTRAMUSCULAR

## 2018-08-07 NOTE — Progress Notes (Signed)
Lupron IM Injection   Due to Prostate Cancer patient is present today for a Lupron Injection.  Medication: Lupron 6 month Dose: 45 mg  Location: right upper outer buttocks Lot: [id number] Exp: 10/19/2020  Patient tolerated well, no complications were noted  Performed by: Elberta Leatherwood, CMA  Follow up: 6 months lupron

## 2018-08-07 NOTE — Patient Instructions (Signed)
Lupron injection given today in Clinic.   Most common Side effects of this medication is:    Fatigue Hot flashes  You will need to continue Vitamin D  Start Calcium to your daily medications.

## 2018-08-20 ENCOUNTER — Encounter: Payer: Self-pay | Admitting: Radiation Oncology

## 2018-08-20 ENCOUNTER — Ambulatory Visit
Admission: RE | Admit: 2018-08-20 | Discharge: 2018-08-20 | Disposition: A | Discharge status: Home / Self Care | Payer: Medicare HMO | Source: Ambulatory Visit | Attending: Radiation Oncology | Admitting: Radiation Oncology

## 2018-08-20 ENCOUNTER — Other Ambulatory Visit: Payer: Self-pay

## 2018-08-20 VITALS — BP 147/92 | HR 75 | Temp 97.2°F | Resp 16 | Wt 177.9 lb

## 2018-08-20 DIAGNOSIS — C61 Malignant neoplasm of prostate: Secondary | ICD-10-CM | POA: Diagnosis not present

## 2018-08-20 DIAGNOSIS — F17210 Nicotine dependence, cigarettes, uncomplicated: Secondary | ICD-10-CM | POA: Diagnosis not present

## 2018-08-20 DIAGNOSIS — R3915 Urgency of urination: Secondary | ICD-10-CM | POA: Insufficient documentation

## 2018-08-20 DIAGNOSIS — Z79899 Other long term (current) drug therapy: Secondary | ICD-10-CM | POA: Insufficient documentation

## 2018-08-20 DIAGNOSIS — N529 Male erectile dysfunction, unspecified: Secondary | ICD-10-CM | POA: Diagnosis not present

## 2018-08-20 DIAGNOSIS — I10 Essential (primary) hypertension: Secondary | ICD-10-CM | POA: Diagnosis not present

## 2018-08-20 DIAGNOSIS — R350 Frequency of micturition: Secondary | ICD-10-CM | POA: Insufficient documentation

## 2018-08-20 NOTE — Consult Note (Signed)
NEW PATIENT EVALUATION  Name: Nathan James  MRN: [id number]  Date:   08/20/2018     DOB: [birth year]   This 68 y.o. 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  REFERRING PHYSICIAN: Rusty Aus, MD  CHIEF COMPLAINT:  Chief Complaint  Patient presents with  . Prostate Cancer    initial consultation of prostate cancer    DIAGNOSIS: The encounter diagnosis was Malignant neoplasm of prostate ([place]).   PREVIOUS INVESTIGATIONS:  CT scans and bone scan reviewedClinical notes reviewed Pathology reports reviewed  HPI: patient is a 68 year old male who presented with elevated PSA for approximately one year. His highest level recently was 6.7 prompting referral to urology. He underwent transrectal ultrasound-guided biopsy showing adenocarcinoma the prostate in 8 of 12 cores. There was 1 core showing Gleason 9(4+5) in 1 core Gleason 8 (4+4). He had a CT scan showing enlargement of the prostate no evidence of extracapsular extension or pelvic lymphadenopathy. Bone scan was within normal limits. Patient does have erectile dysfunction does have some urinary frequency and urgency. He's had his mentation is back and has considerable back pain. He is seen urology has declined surgical resection I.e. robotic prostatectomy and is now referred to radiation oncology for opinion.  PLANNED TREATMENT REGIMEN: I-125 interstitial implant  PAST MEDICAL HISTORY:  has a past medical history of Hypertension.    PAST SURGICAL HISTORY:  Past Surgical History:  Procedure Laterality Date  . BACK SURGERY      FAMILY HISTORY: family history is not on file.  SOCIAL HISTORY:  reports that he has been smoking. He has never used smokeless tobacco. He reports current alcohol use. He reports that he does not use drugs.  ALLERGIES: Atenolol  MEDICATIONS:  Current Outpatient Medications   Medication Sig Dispense Refill  . amLODipine (NORVASC) 10 MG tablet Take 10 mg by mouth daily.  3  . cyclobenzaprine (FLEXERIL) 10 MG tablet TAKE 1 TABLET (10 MG TOTAL) BY MOUTH NIGHTLY AS NEEDED FOR MUSCLE SPASMS.  2  . gabapentin (NEURONTIN) 300 MG capsule TAKE 1 CAPSULE BY MOUTH ONCE A NIGHT    . metoprolol succinate (TOPROL-XL) 100 MG 24 hr tablet TAKE 1 TABLET BY MOUTH EVERY DAY    . oxyCODONE-acetaminophen (PERCOCET/ROXICET) 5-325 MG tablet Take 1 tablet by mouth 2 (two) times daily as needed. for pain  0  . sildenafil (REVATIO) 20 MG tablet Take 1 tablet (20 mg total) by mouth as needed. 30 tablet 11  . traMADol (ULTRAM) 50 MG tablet TAKE 1 TABLET BY MOUTH TWICE A DAY AS NEEDED FOR PAIN    . albuterol (PROVENTIL HFA;VENTOLIN HFA) 108 (90 Base) MCG/ACT inhaler Inhale 2 puffs into the lungs every 6 (six) hours as needed for up to 5 days for wheezing or shortness of breath. 1 Inhaler 0   No current facility-administered medications for this encounter.     ECOG PERFORMANCE STATUS:  0 - Asymptomatic  REVIEW OF SYSTEMS: except for the back pain Patient denies any weight loss, fatigue, weakness, fever, chills or night sweats. Patient denies any loss of vision, blurred vision. Patient denies any ringing  of the ears or hearing loss. No irregular heartbeat. Patient denies heart murmur or history of fainting. Patient denies any chest pain or pain radiating to her upper extremities. Patient denies any shortness of breath, difficulty breathing at night, cough or hemoptysis. Patient  denies any swelling in the lower legs. Patient denies any nausea vomiting, vomiting of blood, or coffee ground material in the vomitus. Patient denies any stomach pain. Patient states has had normal bowel movements no significant constipation or diarrhea. Patient denies any dysuria, hematuria or significant nocturia. Patient denies any problems walking, swelling in the joints or loss of balance. Patient denies any skin  changes, loss of hair or loss of weight. Patient denies any excessive worrying or anxiety or significant depression. Patient denies any problems with insomnia. Patient denies excessive thirst, polyuria, polydipsia. Patient denies any swollen glands, patient denies easy bruising or easy bleeding. Patient denies any recent infections, allergies or URI. Patient "s visual fields have not changed significantly in recent time.    PHYSICAL EXAM: BP (!) 147/92 (BP Location: Left Arm, Patient Position: Sitting)   Pulse 75   Temp (!) 97.2 F (36.2 C) (Tympanic)   Resp 16   Wt 177 lb 14.6 oz (80.7 kg)   BMI 24.81 kg/m  On rectal exam rectal sphincter tone is good prostate is smooth without evidence of nodularity or mass. Sulcus is preserved bilaterally. No other rectal abnormality is identified.Well-developed well-nourished patient in NAD. HEENT reveals PERLA, EOMI, discs not visualized.  Oral cavity is clear. No oral mucosal lesions are identified. Neck is clear without evidence of cervical or supraclavicular adenopathy. Lungs are clear to A&P. Cardiac examination is essentially unremarkable with regular rate and rhythm without murmur rub or thrill. Abdomen is benign with no organomegaly or masses noted. Motor sensory and DTR levels are equal and symmetric in the upper and lower extremities. Cranial nerves II through XII are grossly intact. Proprioception is intact. No peripheral adenopathy or edema is identified. No motor or sensory levels are noted. Crude visual fields are within normal range.  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 Taste on mostly Gleason 7 (4+3) adenocarcinoma he has a 27% chance of organ confined  disease. He has only a 13% chance of lymph node involvement. He has again declined robotic prostatectomy. I will set him up for volume study and I-125 interstitial implant as I believe that is the besttreatment modality for this patient. His nomogram does not rise to the threshold for lymph node involvement to trigger pelvic lymph node treatment. Risks and benefits of treatment including explaining radiation safety precautions Study including increased lower urinary tract symptoms possible diarrhea fatigue alterationof blood counts and risks of general anesthesia all were discussed with the patient. We will coordinate her care with urology. Patient has my recommendations and treatment plan well.  I would like to take this opportunity to thank you for allowing me to participate in the care of your patient.Noreene Filbert, MD

## 2018-08-22 ENCOUNTER — Other Ambulatory Visit: Payer: Self-pay | Admitting: Radiology

## 2018-08-22 DIAGNOSIS — C61 Malignant neoplasm of prostate: Secondary | ICD-10-CM

## 2018-09-04 DIAGNOSIS — J45902 Unspecified asthma with status asthmaticus: Secondary | ICD-10-CM | POA: Diagnosis not present

## 2018-09-04 DIAGNOSIS — C61 Malignant neoplasm of prostate: Secondary | ICD-10-CM | POA: Diagnosis not present

## 2018-09-04 DIAGNOSIS — J40 Bronchitis, not specified as acute or chronic: Secondary | ICD-10-CM | POA: Diagnosis not present

## 2018-10-27 ENCOUNTER — Ambulatory Visit: Payer: Medicare HMO | Admitting: Radiation Oncology

## 2018-11-03 ENCOUNTER — Ambulatory Visit: Payer: Medicare HMO | Admitting: Radiation Oncology

## 2018-11-03 ENCOUNTER — Ambulatory Visit: Admit: 2018-11-03 | Payer: Medicare HMO | Admitting: Urology

## 2018-11-03 DIAGNOSIS — R972 Elevated prostate specific antigen [PSA]: Secondary | ICD-10-CM | POA: Diagnosis not present

## 2018-11-03 DIAGNOSIS — Z79899 Other long term (current) drug therapy: Secondary | ICD-10-CM | POA: Diagnosis not present

## 2018-11-03 SURGERY — ULTRASOUND, PROSTATE, FOR VOLUME DETERMINATION
Anesthesia: Moderate Sedation

## 2018-11-10 ENCOUNTER — Other Ambulatory Visit: Payer: Medicare HMO

## 2018-11-10 ENCOUNTER — Other Ambulatory Visit: Payer: Self-pay

## 2018-11-10 DIAGNOSIS — Z125 Encounter for screening for malignant neoplasm of prostate: Secondary | ICD-10-CM | POA: Diagnosis not present

## 2018-11-10 DIAGNOSIS — M48062 Spinal stenosis, lumbar region with neurogenic claudication: Secondary | ICD-10-CM | POA: Diagnosis not present

## 2018-11-10 DIAGNOSIS — C61 Malignant neoplasm of prostate: Secondary | ICD-10-CM | POA: Diagnosis not present

## 2018-11-10 DIAGNOSIS — I714 Abdominal aortic aneurysm, without rupture, unspecified: Secondary | ICD-10-CM | POA: Insufficient documentation

## 2018-11-10 DIAGNOSIS — Z0000 Encounter for general adult medical examination without abnormal findings: Secondary | ICD-10-CM | POA: Diagnosis not present

## 2018-11-11 ENCOUNTER — Other Ambulatory Visit: Payer: Self-pay

## 2018-11-11 ENCOUNTER — Ambulatory Visit
Admission: RE | Admit: 2018-11-11 | Discharge: 2018-11-11 | Disposition: A | Discharge status: Home / Self Care | Payer: Medicare HMO | Source: Ambulatory Visit | Attending: Radiation Oncology | Admitting: Radiation Oncology

## 2018-11-11 DIAGNOSIS — Z510 Encounter for antineoplastic radiation therapy: Secondary | ICD-10-CM | POA: Diagnosis not present

## 2018-11-11 DIAGNOSIS — Z79899 Other long term (current) drug therapy: Secondary | ICD-10-CM | POA: Insufficient documentation

## 2018-11-11 DIAGNOSIS — C61 Malignant neoplasm of prostate: Secondary | ICD-10-CM | POA: Diagnosis not present

## 2018-11-11 DIAGNOSIS — F17210 Nicotine dependence, cigarettes, uncomplicated: Secondary | ICD-10-CM | POA: Insufficient documentation

## 2018-11-13 DIAGNOSIS — Z510 Encounter for antineoplastic radiation therapy: Secondary | ICD-10-CM | POA: Diagnosis not present

## 2018-11-13 DIAGNOSIS — C61 Malignant neoplasm of prostate: Secondary | ICD-10-CM | POA: Diagnosis not present

## 2018-11-13 DIAGNOSIS — F17210 Nicotine dependence, cigarettes, uncomplicated: Secondary | ICD-10-CM | POA: Diagnosis not present

## 2018-11-13 DIAGNOSIS — Z79899 Other long term (current) drug therapy: Secondary | ICD-10-CM | POA: Diagnosis not present

## 2018-11-14 ENCOUNTER — Other Ambulatory Visit: Payer: Self-pay | Admitting: *Deleted

## 2018-11-14 DIAGNOSIS — C61 Malignant neoplasm of prostate: Secondary | ICD-10-CM

## 2018-11-17 DIAGNOSIS — J45902 Unspecified asthma with status asthmaticus: Secondary | ICD-10-CM | POA: Diagnosis not present

## 2018-11-17 DIAGNOSIS — J40 Bronchitis, not specified as acute or chronic: Secondary | ICD-10-CM | POA: Diagnosis not present

## 2018-11-17 DIAGNOSIS — C61 Malignant neoplasm of prostate: Secondary | ICD-10-CM | POA: Diagnosis not present

## 2018-11-19 ENCOUNTER — Other Ambulatory Visit: Payer: Self-pay

## 2018-11-20 ENCOUNTER — Ambulatory Visit
Admission: RE | Admit: 2018-11-20 | Discharge: 2018-11-20 | Disposition: A | Discharge status: Home / Self Care | Payer: Medicare HMO | Source: Ambulatory Visit | Attending: Radiation Oncology | Admitting: Radiation Oncology

## 2018-11-20 ENCOUNTER — Other Ambulatory Visit: Payer: Self-pay

## 2018-11-24 ENCOUNTER — Encounter: Admission: RE | Payer: Self-pay | Source: Home / Self Care

## 2018-11-24 ENCOUNTER — Ambulatory Visit
Admission: RE | Admit: 2018-11-24 | Discharge: 2018-11-24 | Disposition: A | Discharge status: Home / Self Care | Payer: Medicare HMO | Source: Ambulatory Visit | Attending: Radiation Oncology | Admitting: Radiation Oncology

## 2018-11-24 ENCOUNTER — Other Ambulatory Visit: Payer: Self-pay

## 2018-11-24 ENCOUNTER — Ambulatory Visit: Admission: RE | Admit: 2018-11-24 | Payer: Medicare HMO | Source: Home / Self Care | Admitting: Urology

## 2018-11-24 DIAGNOSIS — Z510 Encounter for antineoplastic radiation therapy: Secondary | ICD-10-CM | POA: Diagnosis not present

## 2018-11-24 DIAGNOSIS — Z79899 Other long term (current) drug therapy: Secondary | ICD-10-CM | POA: Diagnosis not present

## 2018-11-24 DIAGNOSIS — F17210 Nicotine dependence, cigarettes, uncomplicated: Secondary | ICD-10-CM | POA: Diagnosis not present

## 2018-11-24 DIAGNOSIS — C61 Malignant neoplasm of prostate: Secondary | ICD-10-CM | POA: Diagnosis not present

## 2018-11-24 SURGERY — INSERTION, RADIATION SOURCE, PROSTATE
Anesthesia: Choice

## 2018-11-25 ENCOUNTER — Other Ambulatory Visit: Payer: Self-pay

## 2018-11-25 ENCOUNTER — Ambulatory Visit
Admission: RE | Admit: 2018-11-25 | Discharge: 2018-11-25 | Disposition: A | Discharge status: Home / Self Care | Payer: Medicare HMO | Source: Ambulatory Visit | Attending: Radiation Oncology | Admitting: Radiation Oncology

## 2018-11-25 DIAGNOSIS — C61 Malignant neoplasm of prostate: Secondary | ICD-10-CM | POA: Diagnosis not present

## 2018-11-25 DIAGNOSIS — F17210 Nicotine dependence, cigarettes, uncomplicated: Secondary | ICD-10-CM | POA: Diagnosis not present

## 2018-11-25 DIAGNOSIS — Z510 Encounter for antineoplastic radiation therapy: Secondary | ICD-10-CM | POA: Diagnosis not present

## 2018-11-25 DIAGNOSIS — Z79899 Other long term (current) drug therapy: Secondary | ICD-10-CM | POA: Diagnosis not present

## 2018-11-26 ENCOUNTER — Other Ambulatory Visit: Payer: Self-pay

## 2018-11-26 ENCOUNTER — Ambulatory Visit
Admission: RE | Admit: 2018-11-26 | Discharge: 2018-11-26 | Disposition: A | Discharge status: Home / Self Care | Payer: Medicare HMO | Source: Ambulatory Visit | Attending: Radiation Oncology | Admitting: Radiation Oncology

## 2018-11-26 DIAGNOSIS — C61 Malignant neoplasm of prostate: Secondary | ICD-10-CM | POA: Diagnosis not present

## 2018-11-26 DIAGNOSIS — F17210 Nicotine dependence, cigarettes, uncomplicated: Secondary | ICD-10-CM | POA: Diagnosis not present

## 2018-11-26 DIAGNOSIS — Z79899 Other long term (current) drug therapy: Secondary | ICD-10-CM | POA: Diagnosis not present

## 2018-11-26 DIAGNOSIS — Z510 Encounter for antineoplastic radiation therapy: Secondary | ICD-10-CM | POA: Diagnosis not present

## 2018-11-27 ENCOUNTER — Ambulatory Visit
Admission: RE | Admit: 2018-11-27 | Discharge: 2018-11-27 | Disposition: A | Discharge status: Home / Self Care | Payer: Medicare HMO | Source: Ambulatory Visit | Attending: Radiation Oncology | Admitting: Radiation Oncology

## 2018-11-27 ENCOUNTER — Other Ambulatory Visit: Payer: Self-pay

## 2018-11-27 DIAGNOSIS — C61 Malignant neoplasm of prostate: Secondary | ICD-10-CM | POA: Diagnosis not present

## 2018-11-27 DIAGNOSIS — F17210 Nicotine dependence, cigarettes, uncomplicated: Secondary | ICD-10-CM | POA: Diagnosis not present

## 2018-11-27 DIAGNOSIS — Z510 Encounter for antineoplastic radiation therapy: Secondary | ICD-10-CM | POA: Diagnosis not present

## 2018-11-27 DIAGNOSIS — Z79899 Other long term (current) drug therapy: Secondary | ICD-10-CM | POA: Diagnosis not present

## 2018-11-28 ENCOUNTER — Ambulatory Visit
Admission: RE | Admit: 2018-11-28 | Discharge: 2018-11-28 | Disposition: A | Discharge status: Home / Self Care | Payer: Medicare HMO | Source: Ambulatory Visit | Attending: Radiation Oncology | Admitting: Radiation Oncology

## 2018-11-28 ENCOUNTER — Other Ambulatory Visit: Payer: Self-pay

## 2018-11-28 DIAGNOSIS — Z510 Encounter for antineoplastic radiation therapy: Secondary | ICD-10-CM | POA: Diagnosis not present

## 2018-11-28 DIAGNOSIS — Z79899 Other long term (current) drug therapy: Secondary | ICD-10-CM | POA: Diagnosis not present

## 2018-11-28 DIAGNOSIS — C61 Malignant neoplasm of prostate: Secondary | ICD-10-CM | POA: Diagnosis not present

## 2018-11-28 DIAGNOSIS — F17210 Nicotine dependence, cigarettes, uncomplicated: Secondary | ICD-10-CM | POA: Diagnosis not present

## 2018-12-01 ENCOUNTER — Other Ambulatory Visit: Payer: Self-pay

## 2018-12-01 ENCOUNTER — Ambulatory Visit
Admission: RE | Admit: 2018-12-01 | Discharge: 2018-12-01 | Disposition: A | Discharge status: Home / Self Care | Payer: Medicare HMO | Source: Ambulatory Visit | Attending: Radiation Oncology | Admitting: Radiation Oncology

## 2018-12-01 DIAGNOSIS — F17210 Nicotine dependence, cigarettes, uncomplicated: Secondary | ICD-10-CM | POA: Diagnosis not present

## 2018-12-01 DIAGNOSIS — Z510 Encounter for antineoplastic radiation therapy: Secondary | ICD-10-CM | POA: Diagnosis not present

## 2018-12-01 DIAGNOSIS — C61 Malignant neoplasm of prostate: Secondary | ICD-10-CM | POA: Diagnosis not present

## 2018-12-01 DIAGNOSIS — Z79899 Other long term (current) drug therapy: Secondary | ICD-10-CM | POA: Diagnosis not present

## 2018-12-02 ENCOUNTER — Ambulatory Visit
Admission: RE | Admit: 2018-12-02 | Discharge: 2018-12-02 | Disposition: A | Discharge status: Home / Self Care | Payer: Medicare HMO | Source: Ambulatory Visit | Attending: Radiation Oncology | Admitting: Radiation Oncology

## 2018-12-02 ENCOUNTER — Other Ambulatory Visit: Payer: Self-pay

## 2018-12-02 DIAGNOSIS — F17210 Nicotine dependence, cigarettes, uncomplicated: Secondary | ICD-10-CM | POA: Diagnosis not present

## 2018-12-02 DIAGNOSIS — C61 Malignant neoplasm of prostate: Secondary | ICD-10-CM | POA: Diagnosis not present

## 2018-12-02 DIAGNOSIS — Z510 Encounter for antineoplastic radiation therapy: Secondary | ICD-10-CM | POA: Diagnosis not present

## 2018-12-02 DIAGNOSIS — Z79899 Other long term (current) drug therapy: Secondary | ICD-10-CM | POA: Diagnosis not present

## 2018-12-03 ENCOUNTER — Other Ambulatory Visit: Payer: Self-pay

## 2018-12-03 ENCOUNTER — Ambulatory Visit
Admission: RE | Admit: 2018-12-03 | Discharge: 2018-12-03 | Disposition: A | Discharge status: Home / Self Care | Payer: Medicare HMO | Source: Ambulatory Visit | Attending: Radiation Oncology | Admitting: Radiation Oncology

## 2018-12-03 DIAGNOSIS — C61 Malignant neoplasm of prostate: Secondary | ICD-10-CM | POA: Diagnosis not present

## 2018-12-03 DIAGNOSIS — Z510 Encounter for antineoplastic radiation therapy: Secondary | ICD-10-CM | POA: Diagnosis not present

## 2018-12-03 DIAGNOSIS — Z79899 Other long term (current) drug therapy: Secondary | ICD-10-CM | POA: Diagnosis not present

## 2018-12-03 DIAGNOSIS — F17210 Nicotine dependence, cigarettes, uncomplicated: Secondary | ICD-10-CM | POA: Diagnosis not present

## 2018-12-04 ENCOUNTER — Other Ambulatory Visit: Payer: Self-pay

## 2018-12-04 ENCOUNTER — Ambulatory Visit
Admission: RE | Admit: 2018-12-04 | Discharge: 2018-12-04 | Disposition: A | Discharge status: Home / Self Care | Payer: Medicare HMO | Source: Ambulatory Visit | Attending: Radiation Oncology | Admitting: Radiation Oncology

## 2018-12-04 DIAGNOSIS — F17210 Nicotine dependence, cigarettes, uncomplicated: Secondary | ICD-10-CM | POA: Diagnosis not present

## 2018-12-04 DIAGNOSIS — Z510 Encounter for antineoplastic radiation therapy: Secondary | ICD-10-CM | POA: Diagnosis not present

## 2018-12-04 DIAGNOSIS — Z79899 Other long term (current) drug therapy: Secondary | ICD-10-CM | POA: Diagnosis not present

## 2018-12-04 DIAGNOSIS — C61 Malignant neoplasm of prostate: Secondary | ICD-10-CM | POA: Diagnosis not present

## 2018-12-05 ENCOUNTER — Other Ambulatory Visit: Payer: Self-pay

## 2018-12-05 ENCOUNTER — Ambulatory Visit
Admission: RE | Admit: 2018-12-05 | Discharge: 2018-12-05 | Disposition: A | Discharge status: Home / Self Care | Payer: Medicare HMO | Source: Ambulatory Visit | Attending: Radiation Oncology | Admitting: Radiation Oncology

## 2018-12-05 DIAGNOSIS — C61 Malignant neoplasm of prostate: Secondary | ICD-10-CM | POA: Diagnosis not present

## 2018-12-05 DIAGNOSIS — Z79899 Other long term (current) drug therapy: Secondary | ICD-10-CM | POA: Diagnosis not present

## 2018-12-05 DIAGNOSIS — F17210 Nicotine dependence, cigarettes, uncomplicated: Secondary | ICD-10-CM | POA: Diagnosis not present

## 2018-12-05 DIAGNOSIS — Z510 Encounter for antineoplastic radiation therapy: Secondary | ICD-10-CM | POA: Diagnosis not present

## 2018-12-08 ENCOUNTER — Other Ambulatory Visit: Payer: Self-pay

## 2018-12-08 ENCOUNTER — Ambulatory Visit
Admission: RE | Admit: 2018-12-08 | Discharge: 2018-12-08 | Disposition: A | Discharge status: Home / Self Care | Payer: Medicare HMO | Source: Ambulatory Visit | Attending: Radiation Oncology | Admitting: Radiation Oncology

## 2018-12-08 ENCOUNTER — Inpatient Hospital Stay: Payer: Medicare HMO | Attending: Radiation Oncology

## 2018-12-08 DIAGNOSIS — F17210 Nicotine dependence, cigarettes, uncomplicated: Secondary | ICD-10-CM | POA: Diagnosis not present

## 2018-12-08 DIAGNOSIS — Z79899 Other long term (current) drug therapy: Secondary | ICD-10-CM | POA: Diagnosis not present

## 2018-12-08 DIAGNOSIS — C61 Malignant neoplasm of prostate: Secondary | ICD-10-CM | POA: Diagnosis not present

## 2018-12-08 DIAGNOSIS — Z510 Encounter for antineoplastic radiation therapy: Secondary | ICD-10-CM | POA: Diagnosis not present

## 2018-12-08 LAB — CBC
HCT: 39 % (ref 39.0–52.0)
Hemoglobin: 13.4 g/dL (ref 13.0–17.0)
MCH: 29.8 pg (ref 26.0–34.0)
MCHC: 34.4 g/dL (ref 30.0–36.0)
MCV: 86.7 fL (ref 80.0–100.0)
Platelets: 146 10*3/uL — ABNORMAL LOW (ref 150–400)
RBC: 4.5 MIL/uL (ref 4.22–5.81)
RDW: 13.5 % (ref 11.5–15.5)
WBC: 4.9 10*3/uL (ref 4.0–10.5)
nRBC: 0 % (ref 0.0–0.2)

## 2018-12-09 ENCOUNTER — Other Ambulatory Visit: Payer: Self-pay

## 2018-12-09 ENCOUNTER — Ambulatory Visit
Admission: RE | Admit: 2018-12-09 | Discharge: 2018-12-09 | Disposition: A | Discharge status: Home / Self Care | Payer: Medicare HMO | Source: Ambulatory Visit | Attending: Radiation Oncology | Admitting: Radiation Oncology

## 2018-12-09 ENCOUNTER — Emergency Department: Payer: Medicare HMO

## 2018-12-09 ENCOUNTER — Emergency Department
Admission: EM | Admit: 2018-12-09 | Discharge: 2018-12-09 | Disposition: A | Discharge status: Home / Self Care | Payer: Medicare HMO | Attending: Emergency Medicine | Admitting: Emergency Medicine

## 2018-12-09 DIAGNOSIS — Y92410 Unspecified street and highway as the place of occurrence of the external cause: Secondary | ICD-10-CM | POA: Insufficient documentation

## 2018-12-09 DIAGNOSIS — Y9389 Activity, other specified: Secondary | ICD-10-CM | POA: Diagnosis not present

## 2018-12-09 DIAGNOSIS — S3992XA Unspecified injury of lower back, initial encounter: Secondary | ICD-10-CM | POA: Diagnosis not present

## 2018-12-09 DIAGNOSIS — F17210 Nicotine dependence, cigarettes, uncomplicated: Secondary | ICD-10-CM | POA: Insufficient documentation

## 2018-12-09 DIAGNOSIS — Z79899 Other long term (current) drug therapy: Secondary | ICD-10-CM | POA: Insufficient documentation

## 2018-12-09 DIAGNOSIS — S3982XA Other specified injuries of lower back, initial encounter: Secondary | ICD-10-CM | POA: Diagnosis present

## 2018-12-09 DIAGNOSIS — I714 Abdominal aortic aneurysm, without rupture, unspecified: Secondary | ICD-10-CM

## 2018-12-09 DIAGNOSIS — S39012A Strain of muscle, fascia and tendon of lower back, initial encounter: Secondary | ICD-10-CM | POA: Diagnosis not present

## 2018-12-09 DIAGNOSIS — I10 Essential (primary) hypertension: Secondary | ICD-10-CM | POA: Diagnosis not present

## 2018-12-09 DIAGNOSIS — Y999 Unspecified external cause status: Secondary | ICD-10-CM | POA: Insufficient documentation

## 2018-12-09 DIAGNOSIS — S335XXA Sprain of ligaments of lumbar spine, initial encounter: Secondary | ICD-10-CM | POA: Diagnosis not present

## 2018-12-09 DIAGNOSIS — C61 Malignant neoplasm of prostate: Secondary | ICD-10-CM | POA: Diagnosis not present

## 2018-12-09 MED ORDER — METHOCARBAMOL 500 MG PO TABS: 500.0000 mg | ORAL_TABLET | Freq: Four times a day (QID) | ORAL | 0 refills | Status: DC

## 2018-12-09 MED ORDER — METHOCARBAMOL 500 MG PO TABS
1000.0000 mg | ORAL_TABLET | Freq: Once | ORAL | Status: AC
  Administered 2018-12-09: 1000 mg via ORAL
  Filled 2018-12-09: qty 2

## 2018-12-09 MED ORDER — PREDNISONE 20 MG PO TABS
60.0000 mg | ORAL_TABLET | Freq: Once | ORAL | Status: AC
  Administered 2018-12-09: 60 mg via ORAL
  Filled 2018-12-09: qty 3

## 2018-12-09 MED ORDER — PREDNISONE 50 MG PO TABS: 50.0000 mg | ORAL_TABLET | Freq: Every day | ORAL | 0 refills | Status: DC
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
## 2018-12-10 ENCOUNTER — Other Ambulatory Visit: Payer: Self-pay

## 2018-12-10 ENCOUNTER — Ambulatory Visit
Admission: RE | Admit: 2018-12-10 | Discharge: 2018-12-10 | Disposition: A | Discharge status: Home / Self Care | Payer: Medicare HMO | Source: Ambulatory Visit | Attending: Radiation Oncology | Admitting: Radiation Oncology

## 2018-12-10 DIAGNOSIS — Z510 Encounter for antineoplastic radiation therapy: Secondary | ICD-10-CM | POA: Diagnosis not present

## 2018-12-10 DIAGNOSIS — Z79899 Other long term (current) drug therapy: Secondary | ICD-10-CM | POA: Diagnosis not present

## 2018-12-10 DIAGNOSIS — F17210 Nicotine dependence, cigarettes, uncomplicated: Secondary | ICD-10-CM | POA: Diagnosis not present

## 2018-12-10 DIAGNOSIS — C61 Malignant neoplasm of prostate: Secondary | ICD-10-CM | POA: Diagnosis not present

## 2018-12-10 LAB — URINALYSIS, COMPLETE (UACMP) WITH MICROSCOPIC
Bacteria, UA: NONE SEEN
Bilirubin Urine: NEGATIVE
Glucose, UA: NEGATIVE mg/dL
Hgb urine dipstick: NEGATIVE
Ketones, ur: NEGATIVE mg/dL
Leukocytes,Ua: NEGATIVE
Nitrite: NEGATIVE
Protein, ur: NEGATIVE mg/dL
Specific Gravity, Urine: 1.019 (ref 1.005–1.030)
pH: 5 (ref 5.0–8.0)

## 2018-12-11 ENCOUNTER — Ambulatory Visit
Admission: RE | Admit: 2018-12-11 | Discharge: 2018-12-11 | Disposition: A | Discharge status: Home / Self Care | Payer: Medicare HMO | Source: Ambulatory Visit | Attending: Radiation Oncology | Admitting: Radiation Oncology

## 2018-12-11 ENCOUNTER — Other Ambulatory Visit: Payer: Self-pay

## 2018-12-11 DIAGNOSIS — Z510 Encounter for antineoplastic radiation therapy: Secondary | ICD-10-CM | POA: Diagnosis not present

## 2018-12-11 DIAGNOSIS — C61 Malignant neoplasm of prostate: Secondary | ICD-10-CM | POA: Diagnosis not present

## 2018-12-11 DIAGNOSIS — Z79899 Other long term (current) drug therapy: Secondary | ICD-10-CM | POA: Diagnosis not present

## 2018-12-11 DIAGNOSIS — F17210 Nicotine dependence, cigarettes, uncomplicated: Secondary | ICD-10-CM | POA: Diagnosis not present

## 2018-12-12 ENCOUNTER — Ambulatory Visit
Admission: RE | Admit: 2018-12-12 | Discharge: 2018-12-12 | Disposition: A | Discharge status: Home / Self Care | Payer: Medicare HMO | Source: Ambulatory Visit | Attending: Radiation Oncology | Admitting: Radiation Oncology

## 2018-12-12 ENCOUNTER — Other Ambulatory Visit: Payer: Self-pay

## 2018-12-12 DIAGNOSIS — C61 Malignant neoplasm of prostate: Secondary | ICD-10-CM | POA: Diagnosis not present

## 2018-12-12 DIAGNOSIS — Z510 Encounter for antineoplastic radiation therapy: Secondary | ICD-10-CM | POA: Diagnosis not present

## 2018-12-12 DIAGNOSIS — F17210 Nicotine dependence, cigarettes, uncomplicated: Secondary | ICD-10-CM | POA: Diagnosis not present

## 2018-12-12 DIAGNOSIS — Z79899 Other long term (current) drug therapy: Secondary | ICD-10-CM | POA: Diagnosis not present

## 2018-12-15 ENCOUNTER — Other Ambulatory Visit: Payer: Self-pay

## 2018-12-15 ENCOUNTER — Ambulatory Visit
Admission: RE | Admit: 2018-12-15 | Discharge: 2018-12-15 | Disposition: A | Discharge status: Home / Self Care | Payer: Medicare HMO | Source: Ambulatory Visit | Attending: Radiation Oncology | Admitting: Radiation Oncology

## 2018-12-15 DIAGNOSIS — C61 Malignant neoplasm of prostate: Secondary | ICD-10-CM | POA: Diagnosis not present

## 2018-12-15 DIAGNOSIS — Z510 Encounter for antineoplastic radiation therapy: Secondary | ICD-10-CM | POA: Diagnosis not present

## 2018-12-15 DIAGNOSIS — F17210 Nicotine dependence, cigarettes, uncomplicated: Secondary | ICD-10-CM | POA: Diagnosis not present

## 2018-12-15 DIAGNOSIS — Z79899 Other long term (current) drug therapy: Secondary | ICD-10-CM | POA: Diagnosis not present

## 2018-12-16 ENCOUNTER — Other Ambulatory Visit: Payer: Self-pay

## 2018-12-16 ENCOUNTER — Other Ambulatory Visit: Payer: Self-pay | Admitting: *Deleted

## 2018-12-16 ENCOUNTER — Ambulatory Visit
Admission: RE | Admit: 2018-12-16 | Discharge: 2018-12-16 | Disposition: A | Discharge status: Home / Self Care | Payer: Medicare HMO | Source: Ambulatory Visit | Attending: Radiation Oncology | Admitting: Radiation Oncology

## 2018-12-16 DIAGNOSIS — F17210 Nicotine dependence, cigarettes, uncomplicated: Secondary | ICD-10-CM | POA: Diagnosis not present

## 2018-12-16 DIAGNOSIS — Z510 Encounter for antineoplastic radiation therapy: Secondary | ICD-10-CM | POA: Diagnosis not present

## 2018-12-16 DIAGNOSIS — C61 Malignant neoplasm of prostate: Secondary | ICD-10-CM | POA: Diagnosis not present

## 2018-12-16 DIAGNOSIS — Z79899 Other long term (current) drug therapy: Secondary | ICD-10-CM | POA: Diagnosis not present

## 2018-12-16 MED ORDER — TAMSULOSIN HCL 0.4 MG PO CAPS: 0.4000 mg | ORAL_CAPSULE | Freq: Every day | ORAL | 12 refills | Status: AC

## 2018-12-17 ENCOUNTER — Ambulatory Visit
Admission: RE | Admit: 2018-12-17 | Discharge: 2018-12-17 | Disposition: A | Discharge status: Home / Self Care | Payer: Medicare HMO | Source: Ambulatory Visit | Attending: Radiation Oncology | Admitting: Radiation Oncology

## 2018-12-17 ENCOUNTER — Other Ambulatory Visit: Payer: Self-pay

## 2018-12-17 DIAGNOSIS — Z510 Encounter for antineoplastic radiation therapy: Secondary | ICD-10-CM | POA: Diagnosis not present

## 2018-12-17 DIAGNOSIS — Z79899 Other long term (current) drug therapy: Secondary | ICD-10-CM | POA: Diagnosis not present

## 2018-12-17 DIAGNOSIS — F17210 Nicotine dependence, cigarettes, uncomplicated: Secondary | ICD-10-CM | POA: Diagnosis not present

## 2018-12-17 DIAGNOSIS — C61 Malignant neoplasm of prostate: Secondary | ICD-10-CM | POA: Diagnosis not present

## 2018-12-18 ENCOUNTER — Other Ambulatory Visit: Payer: Self-pay

## 2018-12-18 ENCOUNTER — Ambulatory Visit
Admission: RE | Admit: 2018-12-18 | Discharge: 2018-12-18 | Disposition: A | Discharge status: Home / Self Care | Payer: Medicare HMO | Source: Ambulatory Visit | Attending: Radiation Oncology | Admitting: Radiation Oncology

## 2018-12-18 DIAGNOSIS — C61 Malignant neoplasm of prostate: Secondary | ICD-10-CM | POA: Diagnosis not present

## 2018-12-18 DIAGNOSIS — F17210 Nicotine dependence, cigarettes, uncomplicated: Secondary | ICD-10-CM | POA: Diagnosis not present

## 2018-12-18 DIAGNOSIS — Z510 Encounter for antineoplastic radiation therapy: Secondary | ICD-10-CM | POA: Diagnosis not present

## 2018-12-18 DIAGNOSIS — Z79899 Other long term (current) drug therapy: Secondary | ICD-10-CM | POA: Diagnosis not present

## 2018-12-19 ENCOUNTER — Ambulatory Visit
Admission: RE | Admit: 2018-12-19 | Discharge: 2018-12-19 | Disposition: A | Discharge status: Home / Self Care | Payer: Medicare HMO | Source: Ambulatory Visit | Attending: Radiation Oncology | Admitting: Radiation Oncology

## 2018-12-19 ENCOUNTER — Other Ambulatory Visit: Payer: Self-pay

## 2018-12-19 DIAGNOSIS — C61 Malignant neoplasm of prostate: Secondary | ICD-10-CM | POA: Diagnosis not present

## 2018-12-19 DIAGNOSIS — Z79899 Other long term (current) drug therapy: Secondary | ICD-10-CM | POA: Diagnosis not present

## 2018-12-19 DIAGNOSIS — Z510 Encounter for antineoplastic radiation therapy: Secondary | ICD-10-CM | POA: Diagnosis not present

## 2018-12-19 DIAGNOSIS — F17210 Nicotine dependence, cigarettes, uncomplicated: Secondary | ICD-10-CM | POA: Diagnosis not present

## 2018-12-22 ENCOUNTER — Encounter (INDEPENDENT_AMBULATORY_CARE_PROVIDER_SITE_OTHER): Payer: Self-pay

## 2018-12-22 ENCOUNTER — Other Ambulatory Visit: Payer: Self-pay

## 2018-12-22 ENCOUNTER — Inpatient Hospital Stay: Payer: Medicare HMO

## 2018-12-22 ENCOUNTER — Ambulatory Visit
Admission: RE | Admit: 2018-12-22 | Discharge: 2018-12-22 | Disposition: A | Discharge status: Home / Self Care | Payer: Medicare HMO | Source: Ambulatory Visit | Attending: Radiation Oncology | Admitting: Radiation Oncology

## 2018-12-22 ENCOUNTER — Ambulatory Visit: Payer: Medicare HMO

## 2018-12-22 ENCOUNTER — Ambulatory Visit: Payer: Medicare HMO | Admitting: Radiation Oncology

## 2018-12-22 DIAGNOSIS — Z510 Encounter for antineoplastic radiation therapy: Secondary | ICD-10-CM | POA: Diagnosis not present

## 2018-12-22 DIAGNOSIS — F17210 Nicotine dependence, cigarettes, uncomplicated: Secondary | ICD-10-CM | POA: Diagnosis not present

## 2018-12-22 DIAGNOSIS — C61 Malignant neoplasm of prostate: Secondary | ICD-10-CM | POA: Diagnosis not present

## 2018-12-22 DIAGNOSIS — Z79899 Other long term (current) drug therapy: Secondary | ICD-10-CM | POA: Diagnosis not present

## 2018-12-22 LAB — CBC
HCT: 38.4 % — ABNORMAL LOW (ref 39.0–52.0)
Hemoglobin: 12.8 g/dL — ABNORMAL LOW (ref 13.0–17.0)
MCH: 29.4 pg (ref 26.0–34.0)
MCHC: 33.3 g/dL (ref 30.0–36.0)
MCV: 88.1 fL (ref 80.0–100.0)
Platelets: 112 10*3/uL — ABNORMAL LOW (ref 150–400)
RBC: 4.36 MIL/uL (ref 4.22–5.81)
RDW: 13.3 % (ref 11.5–15.5)
WBC: 5 10*3/uL (ref 4.0–10.5)
nRBC: 0 % (ref 0.0–0.2)

## 2018-12-23 ENCOUNTER — Ambulatory Visit
Admission: RE | Admit: 2018-12-23 | Discharge: 2018-12-23 | Disposition: A | Discharge status: Home / Self Care | Payer: Medicare HMO | Source: Ambulatory Visit | Attending: Radiation Oncology | Admitting: Radiation Oncology

## 2018-12-23 ENCOUNTER — Other Ambulatory Visit: Payer: Self-pay

## 2018-12-23 DIAGNOSIS — M5116 Intervertebral disc disorders with radiculopathy, lumbar region: Secondary | ICD-10-CM | POA: Diagnosis not present

## 2018-12-23 DIAGNOSIS — C61 Malignant neoplasm of prostate: Secondary | ICD-10-CM | POA: Diagnosis not present

## 2018-12-23 DIAGNOSIS — Z79899 Other long term (current) drug therapy: Secondary | ICD-10-CM | POA: Diagnosis not present

## 2018-12-23 DIAGNOSIS — Z510 Encounter for antineoplastic radiation therapy: Secondary | ICD-10-CM | POA: Diagnosis not present

## 2018-12-23 DIAGNOSIS — M48062 Spinal stenosis, lumbar region with neurogenic claudication: Secondary | ICD-10-CM | POA: Diagnosis not present

## 2018-12-23 DIAGNOSIS — F17210 Nicotine dependence, cigarettes, uncomplicated: Secondary | ICD-10-CM | POA: Diagnosis not present

## 2018-12-24 ENCOUNTER — Other Ambulatory Visit: Payer: Self-pay

## 2018-12-24 ENCOUNTER — Ambulatory Visit
Admission: RE | Admit: 2018-12-24 | Discharge: 2018-12-24 | Disposition: A | Discharge status: Home / Self Care | Payer: Medicare HMO | Source: Ambulatory Visit | Attending: Radiation Oncology | Admitting: Radiation Oncology

## 2018-12-24 DIAGNOSIS — C61 Malignant neoplasm of prostate: Secondary | ICD-10-CM | POA: Diagnosis not present

## 2018-12-24 DIAGNOSIS — F17210 Nicotine dependence, cigarettes, uncomplicated: Secondary | ICD-10-CM | POA: Diagnosis not present

## 2018-12-24 DIAGNOSIS — Z510 Encounter for antineoplastic radiation therapy: Secondary | ICD-10-CM | POA: Diagnosis not present

## 2018-12-24 DIAGNOSIS — Z79899 Other long term (current) drug therapy: Secondary | ICD-10-CM | POA: Diagnosis not present

## 2018-12-25 ENCOUNTER — Other Ambulatory Visit: Payer: Self-pay

## 2018-12-25 ENCOUNTER — Ambulatory Visit
Admission: RE | Admit: 2018-12-25 | Discharge: 2018-12-25 | Disposition: A | Discharge status: Home / Self Care | Payer: Medicare HMO | Source: Ambulatory Visit | Attending: Radiation Oncology | Admitting: Radiation Oncology

## 2018-12-25 DIAGNOSIS — C61 Malignant neoplasm of prostate: Secondary | ICD-10-CM | POA: Diagnosis not present

## 2018-12-25 DIAGNOSIS — Z510 Encounter for antineoplastic radiation therapy: Secondary | ICD-10-CM | POA: Diagnosis not present

## 2018-12-25 DIAGNOSIS — M5116 Intervertebral disc disorders with radiculopathy, lumbar region: Secondary | ICD-10-CM | POA: Diagnosis not present

## 2018-12-25 DIAGNOSIS — M5416 Radiculopathy, lumbar region: Secondary | ICD-10-CM | POA: Diagnosis not present

## 2018-12-25 DIAGNOSIS — M5442 Lumbago with sciatica, left side: Secondary | ICD-10-CM | POA: Diagnosis not present

## 2018-12-25 DIAGNOSIS — Z79899 Other long term (current) drug therapy: Secondary | ICD-10-CM | POA: Diagnosis not present

## 2018-12-25 DIAGNOSIS — F17210 Nicotine dependence, cigarettes, uncomplicated: Secondary | ICD-10-CM | POA: Diagnosis not present

## 2018-12-26 ENCOUNTER — Ambulatory Visit
Admission: RE | Admit: 2018-12-26 | Discharge: 2018-12-26 | Disposition: A | Discharge status: Home / Self Care | Payer: Medicare HMO | Source: Ambulatory Visit | Attending: Radiation Oncology | Admitting: Radiation Oncology

## 2018-12-26 ENCOUNTER — Other Ambulatory Visit: Payer: Self-pay

## 2018-12-26 DIAGNOSIS — F17210 Nicotine dependence, cigarettes, uncomplicated: Secondary | ICD-10-CM | POA: Diagnosis not present

## 2018-12-26 DIAGNOSIS — Z510 Encounter for antineoplastic radiation therapy: Secondary | ICD-10-CM | POA: Diagnosis not present

## 2018-12-26 DIAGNOSIS — C61 Malignant neoplasm of prostate: Secondary | ICD-10-CM | POA: Diagnosis not present

## 2018-12-26 DIAGNOSIS — Z79899 Other long term (current) drug therapy: Secondary | ICD-10-CM | POA: Diagnosis not present

## 2018-12-30 ENCOUNTER — Ambulatory Visit
Admission: RE | Admit: 2018-12-30 | Discharge: 2018-12-30 | Disposition: A | Discharge status: Home / Self Care | Payer: Medicare HMO | Source: Ambulatory Visit | Attending: Radiation Oncology | Admitting: Radiation Oncology

## 2018-12-30 ENCOUNTER — Other Ambulatory Visit: Payer: Self-pay | Admitting: Student

## 2018-12-30 ENCOUNTER — Other Ambulatory Visit: Payer: Self-pay

## 2018-12-30 DIAGNOSIS — M5442 Lumbago with sciatica, left side: Secondary | ICD-10-CM

## 2018-12-30 DIAGNOSIS — F17210 Nicotine dependence, cigarettes, uncomplicated: Secondary | ICD-10-CM | POA: Diagnosis not present

## 2018-12-30 DIAGNOSIS — C61 Malignant neoplasm of prostate: Secondary | ICD-10-CM | POA: Diagnosis not present

## 2018-12-30 DIAGNOSIS — Z79899 Other long term (current) drug therapy: Secondary | ICD-10-CM | POA: Diagnosis not present

## 2018-12-30 DIAGNOSIS — Z510 Encounter for antineoplastic radiation therapy: Secondary | ICD-10-CM | POA: Diagnosis not present

## 2018-12-31 ENCOUNTER — Ambulatory Visit
Admission: RE | Admit: 2018-12-31 | Discharge: 2018-12-31 | Disposition: A | Discharge status: Home / Self Care | Payer: Medicare HMO | Source: Ambulatory Visit | Attending: Radiation Oncology | Admitting: Radiation Oncology

## 2018-12-31 ENCOUNTER — Other Ambulatory Visit: Payer: Self-pay

## 2018-12-31 DIAGNOSIS — C61 Malignant neoplasm of prostate: Secondary | ICD-10-CM | POA: Diagnosis not present

## 2018-12-31 DIAGNOSIS — Z79899 Other long term (current) drug therapy: Secondary | ICD-10-CM | POA: Diagnosis not present

## 2018-12-31 DIAGNOSIS — F17210 Nicotine dependence, cigarettes, uncomplicated: Secondary | ICD-10-CM | POA: Diagnosis not present

## 2018-12-31 DIAGNOSIS — Z510 Encounter for antineoplastic radiation therapy: Secondary | ICD-10-CM | POA: Diagnosis not present

## 2019-01-01 ENCOUNTER — Other Ambulatory Visit: Payer: Self-pay

## 2019-01-01 ENCOUNTER — Ambulatory Visit
Admission: RE | Admit: 2019-01-01 | Discharge: 2019-01-01 | Disposition: A | Discharge status: Home / Self Care | Payer: Medicare HMO | Source: Ambulatory Visit | Attending: Radiation Oncology | Admitting: Radiation Oncology

## 2019-01-01 DIAGNOSIS — C61 Malignant neoplasm of prostate: Secondary | ICD-10-CM | POA: Diagnosis not present

## 2019-01-01 DIAGNOSIS — Z510 Encounter for antineoplastic radiation therapy: Secondary | ICD-10-CM | POA: Diagnosis not present

## 2019-01-01 DIAGNOSIS — F17210 Nicotine dependence, cigarettes, uncomplicated: Secondary | ICD-10-CM | POA: Diagnosis not present

## 2019-01-01 DIAGNOSIS — Z79899 Other long term (current) drug therapy: Secondary | ICD-10-CM | POA: Diagnosis not present

## 2019-01-02 ENCOUNTER — Other Ambulatory Visit: Payer: Self-pay

## 2019-01-02 ENCOUNTER — Ambulatory Visit
Admission: RE | Admit: 2019-01-02 | Discharge: 2019-01-02 | Disposition: A | Discharge status: Home / Self Care | Payer: Medicare HMO | Source: Ambulatory Visit | Attending: Radiation Oncology | Admitting: Radiation Oncology

## 2019-01-02 DIAGNOSIS — Z79899 Other long term (current) drug therapy: Secondary | ICD-10-CM | POA: Diagnosis not present

## 2019-01-02 DIAGNOSIS — Z510 Encounter for antineoplastic radiation therapy: Secondary | ICD-10-CM | POA: Diagnosis not present

## 2019-01-02 DIAGNOSIS — C61 Malignant neoplasm of prostate: Secondary | ICD-10-CM | POA: Diagnosis not present

## 2019-01-02 DIAGNOSIS — F17210 Nicotine dependence, cigarettes, uncomplicated: Secondary | ICD-10-CM | POA: Diagnosis not present

## 2019-01-04 ENCOUNTER — Ambulatory Visit
Admission: RE | Admit: 2019-01-04 | Discharge: 2019-01-04 | Disposition: A | Discharge status: Home / Self Care | Payer: Medicare HMO | Source: Ambulatory Visit | Attending: Student | Admitting: Student

## 2019-01-04 ENCOUNTER — Other Ambulatory Visit: Payer: Self-pay

## 2019-01-04 DIAGNOSIS — S3992XA Unspecified injury of lower back, initial encounter: Secondary | ICD-10-CM | POA: Diagnosis not present

## 2019-01-04 DIAGNOSIS — M5442 Lumbago with sciatica, left side: Secondary | ICD-10-CM

## 2019-01-04 DIAGNOSIS — M545 Low back pain: Secondary | ICD-10-CM | POA: Diagnosis not present

## 2019-01-05 ENCOUNTER — Encounter (INDEPENDENT_AMBULATORY_CARE_PROVIDER_SITE_OTHER): Payer: Self-pay

## 2019-01-05 ENCOUNTER — Ambulatory Visit
Admission: RE | Admit: 2019-01-05 | Discharge: 2019-01-05 | Disposition: A | Discharge status: Home / Self Care | Payer: Medicare HMO | Source: Ambulatory Visit | Attending: Radiation Oncology | Admitting: Radiation Oncology

## 2019-01-05 ENCOUNTER — Inpatient Hospital Stay: Payer: Medicare HMO | Attending: Radiation Oncology

## 2019-01-05 DIAGNOSIS — M79604 Pain in right leg: Secondary | ICD-10-CM | POA: Insufficient documentation

## 2019-01-05 DIAGNOSIS — M48061 Spinal stenosis, lumbar region without neurogenic claudication: Secondary | ICD-10-CM | POA: Insufficient documentation

## 2019-01-05 DIAGNOSIS — M549 Dorsalgia, unspecified: Secondary | ICD-10-CM | POA: Insufficient documentation

## 2019-01-05 DIAGNOSIS — I10 Essential (primary) hypertension: Secondary | ICD-10-CM | POA: Insufficient documentation

## 2019-01-05 DIAGNOSIS — Z510 Encounter for antineoplastic radiation therapy: Secondary | ICD-10-CM | POA: Insufficient documentation

## 2019-01-05 DIAGNOSIS — M79605 Pain in left leg: Secondary | ICD-10-CM | POA: Insufficient documentation

## 2019-01-05 DIAGNOSIS — Z79899 Other long term (current) drug therapy: Secondary | ICD-10-CM | POA: Diagnosis not present

## 2019-01-05 DIAGNOSIS — C61 Malignant neoplasm of prostate: Secondary | ICD-10-CM | POA: Insufficient documentation

## 2019-01-05 DIAGNOSIS — F17210 Nicotine dependence, cigarettes, uncomplicated: Secondary | ICD-10-CM | POA: Diagnosis not present

## 2019-01-05 LAB — CBC
HCT: 39.5 % (ref 39.0–52.0)
Hemoglobin: 13 g/dL (ref 13.0–17.0)
MCH: 29.3 pg (ref 26.0–34.0)
MCHC: 32.9 g/dL (ref 30.0–36.0)
MCV: 89 fL (ref 80.0–100.0)
Platelets: 150 10*3/uL (ref 150–400)
RBC: 4.44 MIL/uL (ref 4.22–5.81)
RDW: 13.3 % (ref 11.5–15.5)
WBC: 5 10*3/uL (ref 4.0–10.5)
nRBC: 0 % (ref 0.0–0.2)

## 2019-01-06 ENCOUNTER — Other Ambulatory Visit: Payer: Self-pay

## 2019-01-06 ENCOUNTER — Ambulatory Visit
Admission: RE | Admit: 2019-01-06 | Discharge: 2019-01-06 | Disposition: A | Discharge status: Home / Self Care | Payer: Medicare HMO | Source: Ambulatory Visit | Attending: Radiation Oncology | Admitting: Radiation Oncology

## 2019-01-06 DIAGNOSIS — Z510 Encounter for antineoplastic radiation therapy: Secondary | ICD-10-CM | POA: Diagnosis not present

## 2019-01-06 DIAGNOSIS — F17210 Nicotine dependence, cigarettes, uncomplicated: Secondary | ICD-10-CM | POA: Diagnosis not present

## 2019-01-06 DIAGNOSIS — Z79899 Other long term (current) drug therapy: Secondary | ICD-10-CM | POA: Diagnosis not present

## 2019-01-06 DIAGNOSIS — C61 Malignant neoplasm of prostate: Secondary | ICD-10-CM | POA: Diagnosis not present

## 2019-01-07 ENCOUNTER — Other Ambulatory Visit: Payer: Self-pay

## 2019-01-07 ENCOUNTER — Ambulatory Visit
Admission: RE | Admit: 2019-01-07 | Discharge: 2019-01-07 | Disposition: A | Discharge status: Home / Self Care | Payer: Medicare HMO | Source: Ambulatory Visit | Attending: Radiation Oncology | Admitting: Radiation Oncology

## 2019-01-07 DIAGNOSIS — Z510 Encounter for antineoplastic radiation therapy: Secondary | ICD-10-CM | POA: Diagnosis not present

## 2019-01-07 DIAGNOSIS — F17210 Nicotine dependence, cigarettes, uncomplicated: Secondary | ICD-10-CM | POA: Diagnosis not present

## 2019-01-07 DIAGNOSIS — C61 Malignant neoplasm of prostate: Secondary | ICD-10-CM | POA: Diagnosis not present

## 2019-01-07 DIAGNOSIS — Z79899 Other long term (current) drug therapy: Secondary | ICD-10-CM | POA: Diagnosis not present

## 2019-01-08 ENCOUNTER — Ambulatory Visit: Payer: Medicare HMO

## 2019-01-09 ENCOUNTER — Other Ambulatory Visit: Payer: Self-pay

## 2019-01-09 ENCOUNTER — Ambulatory Visit
Admission: RE | Admit: 2019-01-09 | Discharge: 2019-01-09 | Disposition: A | Discharge status: Home / Self Care | Payer: Medicare HMO | Source: Ambulatory Visit | Attending: Radiation Oncology | Admitting: Radiation Oncology

## 2019-01-09 DIAGNOSIS — F17210 Nicotine dependence, cigarettes, uncomplicated: Secondary | ICD-10-CM | POA: Diagnosis not present

## 2019-01-09 DIAGNOSIS — Z510 Encounter for antineoplastic radiation therapy: Secondary | ICD-10-CM | POA: Diagnosis not present

## 2019-01-09 DIAGNOSIS — Z79899 Other long term (current) drug therapy: Secondary | ICD-10-CM | POA: Diagnosis not present

## 2019-01-09 DIAGNOSIS — C61 Malignant neoplasm of prostate: Secondary | ICD-10-CM | POA: Diagnosis not present

## 2019-01-12 ENCOUNTER — Ambulatory Visit
Admission: RE | Admit: 2019-01-12 | Discharge: 2019-01-12 | Disposition: A | Discharge status: Home / Self Care | Payer: Medicare HMO | Source: Ambulatory Visit | Attending: Radiation Oncology | Admitting: Radiation Oncology

## 2019-01-12 ENCOUNTER — Other Ambulatory Visit: Payer: Self-pay

## 2019-01-12 DIAGNOSIS — Z510 Encounter for antineoplastic radiation therapy: Secondary | ICD-10-CM | POA: Diagnosis not present

## 2019-01-12 DIAGNOSIS — Z79899 Other long term (current) drug therapy: Secondary | ICD-10-CM | POA: Diagnosis not present

## 2019-01-12 DIAGNOSIS — C61 Malignant neoplasm of prostate: Secondary | ICD-10-CM | POA: Diagnosis not present

## 2019-01-12 DIAGNOSIS — F17210 Nicotine dependence, cigarettes, uncomplicated: Secondary | ICD-10-CM | POA: Diagnosis not present

## 2019-01-13 ENCOUNTER — Other Ambulatory Visit: Payer: Self-pay

## 2019-01-13 ENCOUNTER — Ambulatory Visit
Admission: RE | Admit: 2019-01-13 | Discharge: 2019-01-13 | Disposition: A | Discharge status: Home / Self Care | Payer: Medicare HMO | Source: Ambulatory Visit | Attending: Radiation Oncology | Admitting: Radiation Oncology

## 2019-01-13 DIAGNOSIS — Z510 Encounter for antineoplastic radiation therapy: Secondary | ICD-10-CM | POA: Diagnosis not present

## 2019-01-13 DIAGNOSIS — F17210 Nicotine dependence, cigarettes, uncomplicated: Secondary | ICD-10-CM | POA: Diagnosis not present

## 2019-01-13 DIAGNOSIS — C61 Malignant neoplasm of prostate: Secondary | ICD-10-CM | POA: Diagnosis not present

## 2019-01-13 DIAGNOSIS — Z79899 Other long term (current) drug therapy: Secondary | ICD-10-CM | POA: Diagnosis not present

## 2019-01-14 ENCOUNTER — Ambulatory Visit
Admission: RE | Admit: 2019-01-14 | Discharge: 2019-01-14 | Disposition: A | Discharge status: Home / Self Care | Payer: Medicare HMO | Source: Ambulatory Visit | Attending: Radiation Oncology | Admitting: Radiation Oncology

## 2019-01-14 ENCOUNTER — Other Ambulatory Visit: Payer: Self-pay

## 2019-01-14 DIAGNOSIS — Z510 Encounter for antineoplastic radiation therapy: Secondary | ICD-10-CM | POA: Diagnosis not present

## 2019-01-14 DIAGNOSIS — C61 Malignant neoplasm of prostate: Secondary | ICD-10-CM | POA: Diagnosis not present

## 2019-01-14 DIAGNOSIS — F17210 Nicotine dependence, cigarettes, uncomplicated: Secondary | ICD-10-CM | POA: Diagnosis not present

## 2019-01-14 DIAGNOSIS — Z79899 Other long term (current) drug therapy: Secondary | ICD-10-CM | POA: Diagnosis not present

## 2019-01-15 ENCOUNTER — Ambulatory Visit
Admission: RE | Admit: 2019-01-15 | Discharge: 2019-01-15 | Disposition: A | Discharge status: Home / Self Care | Payer: Medicare HMO | Source: Ambulatory Visit | Attending: Radiation Oncology | Admitting: Radiation Oncology

## 2019-01-15 ENCOUNTER — Other Ambulatory Visit: Payer: Self-pay

## 2019-01-15 DIAGNOSIS — Z79899 Other long term (current) drug therapy: Secondary | ICD-10-CM | POA: Diagnosis not present

## 2019-01-15 DIAGNOSIS — C61 Malignant neoplasm of prostate: Secondary | ICD-10-CM | POA: Diagnosis not present

## 2019-01-15 DIAGNOSIS — Z510 Encounter for antineoplastic radiation therapy: Secondary | ICD-10-CM | POA: Diagnosis not present

## 2019-01-15 DIAGNOSIS — F17210 Nicotine dependence, cigarettes, uncomplicated: Secondary | ICD-10-CM | POA: Diagnosis not present

## 2019-01-16 ENCOUNTER — Ambulatory Visit
Admission: RE | Admit: 2019-01-16 | Discharge: 2019-01-16 | Disposition: A | Discharge status: Home / Self Care | Payer: Medicare HMO | Source: Ambulatory Visit | Attending: Radiation Oncology | Admitting: Radiation Oncology

## 2019-01-16 ENCOUNTER — Other Ambulatory Visit: Payer: Self-pay

## 2019-01-16 DIAGNOSIS — Z79899 Other long term (current) drug therapy: Secondary | ICD-10-CM | POA: Diagnosis not present

## 2019-01-16 DIAGNOSIS — F17210 Nicotine dependence, cigarettes, uncomplicated: Secondary | ICD-10-CM | POA: Diagnosis not present

## 2019-01-16 DIAGNOSIS — C61 Malignant neoplasm of prostate: Secondary | ICD-10-CM | POA: Diagnosis not present

## 2019-01-16 DIAGNOSIS — Z510 Encounter for antineoplastic radiation therapy: Secondary | ICD-10-CM | POA: Diagnosis not present

## 2019-01-19 ENCOUNTER — Other Ambulatory Visit: Payer: Self-pay

## 2019-01-19 ENCOUNTER — Ambulatory Visit
Admission: RE | Admit: 2019-01-19 | Discharge: 2019-01-19 | Disposition: A | Discharge status: Home / Self Care | Payer: Medicare HMO | Source: Ambulatory Visit | Attending: Radiation Oncology | Admitting: Radiation Oncology

## 2019-01-19 ENCOUNTER — Ambulatory Visit: Payer: Medicare HMO

## 2019-01-19 DIAGNOSIS — Z79899 Other long term (current) drug therapy: Secondary | ICD-10-CM | POA: Diagnosis not present

## 2019-01-19 DIAGNOSIS — Z510 Encounter for antineoplastic radiation therapy: Secondary | ICD-10-CM | POA: Diagnosis not present

## 2019-01-19 DIAGNOSIS — C61 Malignant neoplasm of prostate: Secondary | ICD-10-CM | POA: Diagnosis not present

## 2019-01-19 DIAGNOSIS — F17210 Nicotine dependence, cigarettes, uncomplicated: Secondary | ICD-10-CM | POA: Diagnosis not present

## 2019-01-20 ENCOUNTER — Ambulatory Visit
Admission: RE | Admit: 2019-01-20 | Discharge: 2019-01-20 | Disposition: A | Discharge status: Home / Self Care | Payer: Medicare HMO | Source: Ambulatory Visit | Attending: Radiation Oncology | Admitting: Radiation Oncology

## 2019-01-20 ENCOUNTER — Other Ambulatory Visit: Payer: Self-pay

## 2019-01-20 DIAGNOSIS — Z79899 Other long term (current) drug therapy: Secondary | ICD-10-CM | POA: Diagnosis not present

## 2019-01-20 DIAGNOSIS — C61 Malignant neoplasm of prostate: Secondary | ICD-10-CM | POA: Diagnosis not present

## 2019-01-20 DIAGNOSIS — F17210 Nicotine dependence, cigarettes, uncomplicated: Secondary | ICD-10-CM | POA: Diagnosis not present

## 2019-01-20 DIAGNOSIS — Z510 Encounter for antineoplastic radiation therapy: Secondary | ICD-10-CM | POA: Diagnosis not present

## 2019-01-28 DIAGNOSIS — M5442 Lumbago with sciatica, left side: Secondary | ICD-10-CM | POA: Diagnosis not present

## 2019-02-05 ENCOUNTER — Telehealth: Payer: Self-pay | Admitting: Urology

## 2019-02-05 ENCOUNTER — Other Ambulatory Visit: Payer: Self-pay

## 2019-02-05 ENCOUNTER — Ambulatory Visit (INDEPENDENT_AMBULATORY_CARE_PROVIDER_SITE_OTHER): Payer: Medicare HMO | Admitting: *Deleted

## 2019-02-05 DIAGNOSIS — C61 Malignant neoplasm of prostate: Secondary | ICD-10-CM

## 2019-02-05 DIAGNOSIS — M5442 Lumbago with sciatica, left side: Secondary | ICD-10-CM | POA: Diagnosis not present

## 2019-02-05 MED ORDER — LEUPROLIDE ACETATE (6 MONTH) 45 MG IM KIT
45.0000 mg | PACK | Freq: Once | INTRAMUSCULAR | Status: AC
  Administered 2019-02-05: 45 mg via INTRAMUSCULAR

## 2019-02-05 NOTE — Telephone Encounter (Signed)
NO PA REQUIRED FOR LUPRON 02-05-19 MICHELLE

## 2019-02-05 NOTE — Progress Notes (Signed)
Lupron IM Injection   Due to Prostate Cancer patient is present today for a Lupron Injection.  Medication: Lupron 49month Dose: 45 mg  Location: left upper outer buttocks Lot: [id number] Exp:   Patient tolerated well, no complications were noted  Performed by: Verlene Mayer, CMA  Follow up: 6 months

## 2019-02-05 NOTE — Patient Instructions (Signed)
Take Vitamin D and Calcium daily 

## 2019-02-12 DIAGNOSIS — M5442 Lumbago with sciatica, left side: Secondary | ICD-10-CM | POA: Diagnosis not present

## 2019-03-02 ENCOUNTER — Ambulatory Visit: Payer: Medicare HMO | Attending: Radiation Oncology | Admitting: Radiation Oncology

## 2019-05-02 ENCOUNTER — Encounter: Payer: Self-pay | Admitting: Emergency Medicine

## 2019-05-02 ENCOUNTER — Emergency Department: Payer: Medicare HMO

## 2019-05-02 ENCOUNTER — Emergency Department
Admission: EM | Admit: 2019-05-02 | Discharge: 2019-05-02 | Disposition: A | Discharge status: Home / Self Care | Payer: Medicare HMO | Attending: Student | Admitting: Student

## 2019-05-02 ENCOUNTER — Other Ambulatory Visit: Payer: Self-pay

## 2019-05-02 DIAGNOSIS — I10 Essential (primary) hypertension: Secondary | ICD-10-CM | POA: Diagnosis not present

## 2019-05-02 DIAGNOSIS — Z79899 Other long term (current) drug therapy: Secondary | ICD-10-CM | POA: Diagnosis not present

## 2019-05-02 DIAGNOSIS — R05 Cough: Secondary | ICD-10-CM | POA: Diagnosis not present

## 2019-05-02 DIAGNOSIS — J439 Emphysema, unspecified: Secondary | ICD-10-CM | POA: Diagnosis not present

## 2019-05-02 DIAGNOSIS — R0602 Shortness of breath: Secondary | ICD-10-CM | POA: Diagnosis not present

## 2019-05-02 DIAGNOSIS — F17200 Nicotine dependence, unspecified, uncomplicated: Secondary | ICD-10-CM | POA: Insufficient documentation

## 2019-05-02 DIAGNOSIS — Z20828 Contact with and (suspected) exposure to other viral communicable diseases: Secondary | ICD-10-CM | POA: Diagnosis not present

## 2019-05-02 MED ORDER — ALBUTEROL SULFATE HFA 108 (90 BASE) MCG/ACT IN AERS: 2.0000 | INHALATION_SPRAY | Freq: Four times a day (QID) | RESPIRATORY_TRACT | 1 refills | Status: DC | PRN

## 2019-05-02 MED ORDER — BENZONATATE 100 MG PO CAPS: ORAL_CAPSULE | ORAL | 0 refills | Status: DC

## 2019-05-02 MED ORDER — PREDNISONE 20 MG PO TABS: 20.0000 mg | ORAL_TABLET | Freq: Two times a day (BID) | ORAL | 0 refills | Status: AC

## 2019-05-02 NOTE — ED Triage Notes (Signed)
Cough x 3 days, especially at night.

## 2019-05-02 NOTE — Discharge Instructions (Signed)
Your exam and CXR reveal chronic changes consistent with COPD (Chronic Obstructive Pulmonary Disease) is a condition of lung changes, commonly caused by smoking. You should use the inhaler as needed, and take the steroids as directed. Follow-up with Dr. Sabra Heck to discuss ways to quit smoking. Return to the ED as needed.

## 2019-05-02 NOTE — ED Notes (Signed)
Pt c/o producive cough x3 days, mostly at night. Pt denies any nausea, diarrhea, or chest pain at this time.

## 2019-05-02 NOTE — ED Provider Notes (Signed)
[facility] Emergency Department Provider Note ____________________________________________  Time seen: 1052  I have reviewed the triage vital signs and the nursing notes.  HISTORY  Chief Complaint  Cough  HPI Nathan James is a 68 y.o. male with a history of prostate cancer and previous radiation therapy, presents himself to the ED for evaluation of persistent cough.  Patient describes a cough that has been persistent for the last 10 to 12 days.  He had been recently treated by his primary care provider with a round of azithromycin as well as prednisone and Mucinex.  He reports some improvement after the initial round of antibiotics.  He presents now reporting 3 days of productive nighttime cough as well as shortness of breath.  He denies any fever, chills, or sweats.  He also denies any chest pain, sinus congestion, nausea, vomiting, diarrhea.  Patient describes a non-bloody sputum as well as audible chest congestion.  He denies any sick contacts, recent travel, or other high risk of exposures.  He has not been previously tested for COVID-19.   Past Medical History:  Diagnosis Date  . Hypertension     Patient Active Problem List   Diagnosis Date Noted  . Abnormal PSA 11/06/2017  . Tobacco abuse 05/07/2017  . Benign essential hypertension 10/31/2016  . Medicare annual wellness visit, initial 10/31/2016  . Lumbar spinal stenosis 02/22/2014    Past Surgical History:  Procedure Laterality Date  . BACK SURGERY      Prior to Admission medications   Medication Sig Start Date End Date Taking? Authorizing Provider  albuterol (VENTOLIN HFA) 108 (90 Base) MCG/ACT inhaler Inhale 2 puffs into the lungs every 6 (six) hours as needed for shortness of breath. 05/02/19 06/01/19  Margaret Staggs, Dannielle Karvonen, PA-C  amLODipine (NORVASC) 10 MG tablet Take 10 mg by mouth daily. 04/14/18   [provider]  benzonatate (TESSALON PERLES) 100 MG capsule Take 1-2 tabs TID prn  cough 05/02/19   Anola Mcgough, Dannielle Karvonen, PA-C  cyclobenzaprine (FLEXERIL) 10 MG tablet TAKE 1 TABLET (10 MG TOTAL) BY MOUTH NIGHTLY AS NEEDED FOR MUSCLE SPASMS. 03/29/18   [provider]  gabapentin (NEURONTIN) 300 MG capsule TAKE 1 CAPSULE BY MOUTH ONCE A NIGHT 04/11/18   [provider]  methocarbamol (ROBAXIN) 500 MG tablet Take 1 tablet (500 mg total) by mouth 4 (four) times daily. 12/09/18   Cuthriell, Charline Bills, PA-C  metoprolol succinate (TOPROL-XL) 100 MG 24 hr tablet TAKE 1 TABLET BY MOUTH EVERY DAY 02/25/15   [provider]  oxyCODONE-acetaminophen (PERCOCET/ROXICET) 5-325 MG tablet Take 1 tablet by mouth 2 (two) times daily as needed. for pain 04/23/18   [provider]  predniSONE (DELTASONE) 20 MG tablet Take 1 tablet (20 mg total) by mouth 2 (two) times daily with a meal for 5 days. 05/02/19 05/07/19  Glorya Bartley, Dannielle Karvonen, PA-C  sildenafil (REVATIO) 20 MG tablet Take 1 tablet (20 mg total) by mouth as needed. 07/01/18   Billey Co, MD  tamsulosin (FLOMAX) 0.4 MG CAPS capsule Take 1 capsule (0.4 mg total) by mouth daily after supper. 12/16/18   Noreene Filbert, MD  traMADol (ULTRAM) 50 MG tablet TAKE 1 TABLET BY MOUTH TWICE A DAY AS NEEDED FOR PAIN 06/06/17   [provider]    Allergies Atenolol  No family history on file.  Social History Social History   Tobacco Use  . Smoking status: Current Every Day Smoker  . Smokeless tobacco: Never Used  Substance Use Topics  . Alcohol use: Yes  . Drug use: Never    Review of Systems  Constitutional: Negative for fever. Eyes: Negative for visual changes. ENT: Negative for sore throat. Cardiovascular: Negative for chest pain. Respiratory: Positive for shortness of breath and productive cough. Gastrointestinal: Negative for abdominal pain, vomiting and diarrhea. Genitourinary: Negative for dysuria. Musculoskeletal: Negative for back pain. Skin: Negative for rash. Neurological:  Negative for headaches, focal weakness or numbness. ____________________________________________  PHYSICAL EXAM:  VITAL SIGNS: ED Triage Vitals  Enc Vitals Group     BP 05/02/19 1041 137/88     Pulse Rate 05/02/19 1041 66     Resp 05/02/19 1041 18     Temp 05/02/19 1041 97.7 F (36.5 C)     Temp Source 05/02/19 1041 Oral     SpO2 05/02/19 1041 97 %     Weight 05/02/19 1042 170 lb (77.1 kg)     Height 05/02/19 1042 6' (1.829 m)     Head Circumference --      Peak Flow --      Pain Score 05/02/19 1042 0     Pain Loc --      Pain Edu? --      Excl. in [place]? --     Constitutional: Alert and oriented. Well appearing and in no distress. Head: Normocephalic and atraumatic. Eyes: Conjunctivae are normal. PERRL. Normal extraocular movements Ears: Canals clear. TMs intact bilaterally. Cardiovascular: Normal rate, regular rhythm. Normal distal pulses. Respiratory: Normal respiratory effort. No wheezes/rhonchi. Bilateral rales noted.  Musculoskeletal: Nontender with normal range of motion in all extremities.  Neurologic:  Normal gait without ataxia. Normal speech and language. No gross focal neurologic deficits are appreciated. Skin:  Skin is warm, dry and intact. No rash noted. ____________________________________________   RADIOLOGY  CXR IMPRESSION: Emphysema without acute cardiopulmonary findings ____________________________________________  PROCEDURES  Procedures ____________________________________________  INITIAL IMPRESSION / ASSESSMENT AND PLAN / ED COURSE  Patient with ED evaluation of a 3-day complaint of worsening shortness of breath and productive cough.  Patient clinical picture is concerning for possible viral etiology including COVID-19 versus a possible underlying infectious process including community-acquired pneumonia.  Patient presentation is reassuring as there are no signs of acute respiratory distress or dyspnea on exertion.  Patient's x-ray confirms changes  consistent with emphysema and COPD.  Patient is given discharge instructions on smoking cessation and management of COPD.  He is discharged with prescriptions for albuterol, prednisone, and Tessalon Perles.  He will follow-up with his primary care provider as scheduled.  Return precautions have been reviewed.  Nathan James was evaluated in Emergency Department on 05/02/2019 for the symptoms described in the history of present illness. He was evaluated in the context of the global COVID-19 pandemic, which necessitated consideration that the patient might be at risk for infection with the SARS-CoV-2 virus that causes COVID-19. Institutional protocols and algorithms that pertain to the evaluation of patients at risk for COVID-19 are in a state of rapid change based on information released by regulatory bodies including the CDC and federal and state organizations. These policies and algorithms were followed during the patient's care in the ED. ____________________________________________  FINAL CLINICAL IMPRESSION(S) / ED DIAGNOSES  Final diagnoses:  Pulmonary emphysema, unspecified emphysema type ([place])      Carmie End, Dannielle Karvonen, PA-C 05/02/19 1238    Lilia Pro., MD 05/02/19 [id number]

## 2019-05-07 DIAGNOSIS — C61 Malignant neoplasm of prostate: Secondary | ICD-10-CM | POA: Diagnosis not present

## 2019-05-14 DIAGNOSIS — I10 Essential (primary) hypertension: Secondary | ICD-10-CM | POA: Diagnosis not present

## 2019-05-14 DIAGNOSIS — Z23 Encounter for immunization: Secondary | ICD-10-CM | POA: Diagnosis not present

## 2019-05-14 DIAGNOSIS — E876 Hypokalemia: Secondary | ICD-10-CM | POA: Diagnosis not present

## 2019-05-14 DIAGNOSIS — Z0000 Encounter for general adult medical examination without abnormal findings: Secondary | ICD-10-CM | POA: Diagnosis not present

## 2019-05-14 DIAGNOSIS — F17210 Nicotine dependence, cigarettes, uncomplicated: Secondary | ICD-10-CM | POA: Diagnosis not present

## 2019-05-14 DIAGNOSIS — C61 Malignant neoplasm of prostate: Secondary | ICD-10-CM | POA: Diagnosis not present

## 2019-05-14 DIAGNOSIS — Z79899 Other long term (current) drug therapy: Secondary | ICD-10-CM | POA: Diagnosis not present

## 2019-05-14 DIAGNOSIS — M48061 Spinal stenosis, lumbar region without neurogenic claudication: Secondary | ICD-10-CM | POA: Diagnosis not present

## 2019-06-10 ENCOUNTER — Emergency Department: Payer: Medicare HMO

## 2019-06-10 ENCOUNTER — Encounter: Payer: Self-pay | Admitting: *Deleted

## 2019-06-10 ENCOUNTER — Emergency Department
Admission: EM | Admit: 2019-06-10 | Discharge: 2019-06-11 | Disposition: A | Discharge status: Home / Self Care | Payer: Medicare HMO | Attending: Emergency Medicine | Admitting: Emergency Medicine

## 2019-06-10 ENCOUNTER — Other Ambulatory Visit: Payer: Self-pay

## 2019-06-10 DIAGNOSIS — Z79899 Other long term (current) drug therapy: Secondary | ICD-10-CM | POA: Diagnosis not present

## 2019-06-10 DIAGNOSIS — Z20828 Contact with and (suspected) exposure to other viral communicable diseases: Secondary | ICD-10-CM | POA: Insufficient documentation

## 2019-06-10 DIAGNOSIS — I10 Essential (primary) hypertension: Secondary | ICD-10-CM | POA: Insufficient documentation

## 2019-06-10 DIAGNOSIS — F17210 Nicotine dependence, cigarettes, uncomplicated: Secondary | ICD-10-CM | POA: Diagnosis not present

## 2019-06-10 DIAGNOSIS — J209 Acute bronchitis, unspecified: Secondary | ICD-10-CM | POA: Insufficient documentation

## 2019-06-10 DIAGNOSIS — R0600 Dyspnea, unspecified: Secondary | ICD-10-CM | POA: Diagnosis not present

## 2019-06-10 DIAGNOSIS — R0602 Shortness of breath: Secondary | ICD-10-CM | POA: Diagnosis not present

## 2019-06-10 LAB — BASIC METABOLIC PANEL
Anion gap: 9 (ref 5–15)
BUN: 19 mg/dL (ref 8–23)
CO2: 29 mmol/L (ref 22–32)
Calcium: 9.6 mg/dL (ref 8.9–10.3)
Chloride: 103 mmol/L (ref 98–111)
Creatinine, Ser: 1.04 mg/dL (ref 0.61–1.24)
GFR calc Af Amer: >60 mL/min (ref >60)
GFR calc non Af Amer: >60 mL/min (ref >60)
Glucose, Bld: 105 mg/dL — ABNORMAL HIGH (ref 70–99)
Potassium: 4.7 mmol/L (ref 3.5–5.1)
Sodium: 141 mmol/L (ref 135–145)

## 2019-06-10 LAB — CBC
HCT: 43.2 % (ref 39.0–52.0)
Hemoglobin: 14.1 g/dL (ref 13.0–17.0)
MCH: 29.2 pg (ref 26.0–34.0)
MCHC: 32.6 g/dL (ref 30.0–36.0)
MCV: 89.4 fL (ref 80.0–100.0)
Platelets: 143 10*3/uL — ABNORMAL LOW (ref 150–400)
RBC: 4.83 MIL/uL (ref 4.22–5.81)
RDW: 12.4 % (ref 11.5–15.5)
WBC: 4.9 10*3/uL (ref 4.0–10.5)
nRBC: 0 % (ref 0.0–0.2)

## 2019-06-10 LAB — TROPONIN I (HIGH SENSITIVITY)
Troponin I (High Sensitivity): 5 ng/L (ref <18)
Troponin I (High Sensitivity): 5 ng/L (ref <18)

## 2019-06-10 MED ORDER — SODIUM CHLORIDE 0.9% FLUSH: 3.0000 mL | Freq: Once | INTRAVENOUS | Status: DC

## 2019-06-10 MED ORDER — IPRATROPIUM-ALBUTEROL 0.5-2.5 (3) MG/3ML IN SOLN
3.0000 mL | Freq: Once | RESPIRATORY_TRACT | Status: AC
  Administered 2019-06-10: 3 mL via RESPIRATORY_TRACT

## 2019-06-10 MED ORDER — IPRATROPIUM-ALBUTEROL 0.5-2.5 (3) MG/3ML IN SOLN
RESPIRATORY_TRACT | Status: AC
  Administered 2019-06-10: 3 mL via RESPIRATORY_TRACT
  Filled 2019-06-10: qty 6

## 2019-06-10 MED ORDER — METHYLPREDNISOLONE SODIUM SUCC 125 MG IJ SOLR
INTRAMUSCULAR | Status: AC
  Administered 2019-06-10: 125 mg via INTRAVENOUS
  Filled 2019-06-10: qty 2

## 2019-06-10 MED ORDER — METHYLPREDNISOLONE SODIUM SUCC 125 MG IJ SOLR
125.0000 mg | Freq: Once | INTRAMUSCULAR | Status: AC
  Administered 2019-06-10: 125 mg via INTRAVENOUS

## 2019-06-10 NOTE — ED Triage Notes (Signed)
Pt to triage via wheelchair.  Pt has sob for 5 days.  No chest pain.  cig smoker.  Pt has a cough . No fever.  No n/v/  Pt has an empty inhaler x 2 days.  Pt alert  Speech clear.

## 2019-06-10 NOTE — ED Notes (Signed)
Pt placed on portable monitor. 

## 2019-06-10 NOTE — ED Provider Notes (Signed)
[facility] Emergency Department Provider Note   First MD Initiated Contact with Patient 06/10/19 2327     (approximate)  I have reviewed the triage vital signs and the nursing notes.   HISTORY  Chief Complaint Shortness of Breath    HPI Nathan James is a 68 y.o. male with history of 1 pack/day cigarette use presents to the emergency department secondary to a 5-day history of progressive dyspnea cough chills.  Patient denies any sick contact.  Patient denies any chest pain.  Patient denies any lower extremity pain or swelling.      Past Medical History:  Diagnosis Date   Hypertension     Patient Active Problem List   Diagnosis Date Noted   Abnormal PSA 11/06/2017   Tobacco abuse 05/07/2017   Benign essential hypertension 10/31/2016   Medicare annual wellness visit, initial 10/31/2016   Lumbar spinal stenosis 02/22/2014    Past Surgical History:  Procedure Laterality Date   BACK SURGERY      Prior to Admission medications   Medication Sig Start Date End Date Taking? Authorizing Provider  albuterol (VENTOLIN HFA) 108 (90 Base) MCG/ACT inhaler Inhale 2 puffs into the lungs every 6 (six) hours as needed for shortness of breath. 05/02/19 06/01/19  Menshew, Dannielle Karvonen, PA-C  amLODipine (NORVASC) 10 MG tablet Take 10 mg by mouth daily. 04/14/18   [provider]  benzonatate (TESSALON PERLES) 100 MG capsule Take 1-2 tabs TID prn cough 05/02/19   Menshew, Dannielle Karvonen, PA-C  cyclobenzaprine (FLEXERIL) 10 MG tablet TAKE 1 TABLET (10 MG TOTAL) BY MOUTH NIGHTLY AS NEEDED FOR MUSCLE SPASMS. 03/29/18   [provider]  gabapentin (NEURONTIN) 300 MG capsule TAKE 1 CAPSULE BY MOUTH ONCE A NIGHT 04/11/18   [provider]  methocarbamol (ROBAXIN) 500 MG tablet Take 1 tablet (500 mg total) by mouth 4 (four) times daily. 12/09/18   Cuthriell, Charline Bills, PA-C  metoprolol succinate (TOPROL-XL) 100 MG 24 hr tablet TAKE 1 TABLET BY  MOUTH EVERY DAY 02/25/15   [provider]  oxyCODONE-acetaminophen (PERCOCET/ROXICET) 5-325 MG tablet Take 1 tablet by mouth 2 (two) times daily as needed. for pain 04/23/18   [provider]  sildenafil (REVATIO) 20 MG tablet Take 1 tablet (20 mg total) by mouth as needed. 07/01/18   Billey Co, MD  tamsulosin (FLOMAX) 0.4 MG CAPS capsule Take 1 capsule (0.4 mg total) by mouth daily after supper. 12/16/18   Noreene Filbert, MD  traMADol (ULTRAM) 50 MG tablet TAKE 1 TABLET BY MOUTH TWICE A DAY AS NEEDED FOR PAIN 06/06/17   [provider]    Allergies Atenolol  No family history on file.  Social History Social History   Tobacco Use   Smoking status: Current Every Day Smoker   Smokeless tobacco: Never Used  Substance Use Topics   Alcohol use: Yes   Drug use: Never    Review of Systems Constitutional: No fever/chills Eyes: No visual changes. ENT: No sore throat. Cardiovascular: Denies chest pain. Respiratory: Positive for cough and dyspnea Gastrointestinal: No abdominal pain.  No nausea, no vomiting.  No diarrhea.  No constipation. Genitourinary: Negative for dysuria. Musculoskeletal: Negative for neck pain.  Negative for back pain. Integumentary: Negative for rash. Neurological: Negative for headaches, focal weakness or numbness.  ____________________________________________   PHYSICAL EXAM:  VITAL SIGNS: ED Triage Vitals  Enc Vitals Group     BP 06/10/19 1849 (!) 153/100     Pulse Rate  06/10/19 1849 73     Resp 06/10/19 1849 (!) 24     Temp 06/10/19 1849 97.6 F (36.4 C)     Temp Source 06/10/19 1849 Oral     SpO2 06/10/19 1849 90 %     Weight 06/10/19 1850 79.4 kg (175 lb)     Height 06/10/19 1850 1.829 m (6')     Head Circumference --      Peak Flow --      Pain Score 06/10/19 1849 0     Pain Loc --      Pain Edu? --      Excl. in [place]? --     Constitutional: Alert and oriented.  Eyes: Conjunctivae are normal.  Head:  Atraumatic. Mouth/Throat: Patient is wearing a mask. Neck: No stridor.  No meningeal signs.   Cardiovascular: Normal rate, regular rhythm. Good peripheral circulation. Grossly normal heart sounds. Respiratory: Tachypnea, coarse expiratory wheezing and rhonchi diffusely Gastrointestinal: Soft and nontender. No distention.  Musculoskeletal: No lower extremity tenderness nor edema. No gross deformities of extremities. Neurologic:  Normal speech and language. No gross focal neurologic deficits are appreciated.  Skin:  Skin is warm, dry and intact. Psychiatric: Mood and affect are normal. Speech and behavior are normal.  ____________________________________________   LABS (all labs ordered are listed, but only abnormal results are displayed)  Labs Reviewed  BASIC METABOLIC PANEL - Abnormal; Notable for the following components:      Result Value   Glucose, Bld 105 (*)    All other components within normal limits  CBC - Abnormal; Notable for the following components:   Platelets 143 (*)    All other components within normal limits  TROPONIN I (HIGH SENSITIVITY)  TROPONIN I (HIGH SENSITIVITY)   ____________________________________________  EKG  ED ECG REPORT I, Formoso N Daielle Melcher, the attending physician, personally viewed and interpreted this ECG.   Date: 06/10/2019  EKG Time: 6:49 PM  Rate: 74  Rhythm: Normal sinus rhythm  Axis: Normal  Intervals: Normal  ST&T Change: None  ____________________________________________  RADIOLOGY I, Crowley N Kelisha Dall, personally viewed and evaluated these images (plain radiographs) as part of my medical decision making, as well as reviewing the written report by the radiologist.  ED MD interpretation: No active cardiopulmonary disease.  Official radiology report(s): Dg Chest 2 View  Result Date: 06/10/2019 CLINICAL DATA:  Shortness of breath EXAM: CHEST - 2 VIEW COMPARISON:  05/02/2019 FINDINGS: Hyperinflation. Mild apical scar. No  consolidation or pleural effusion. Stable cardiomediastinal silhouette. No pneumothorax. IMPRESSION: No active cardiopulmonary disease. Electronically Signed   By: Donavan Foil M.D.   On: 06/10/2019 19:31     .Critical Care Performed by: Gregor Hams, MD Authorized by: Gregor Hams, MD   Critical care provider statement:    Critical care time (minutes):  30   Critical care time was exclusive of:  Separately billable procedures and treating other patients   Critical care was necessary to treat or prevent imminent or life-threatening deterioration of the following conditions:  Respiratory failure   Critical care was time spent personally by me on the following activities:  Development of treatment plan with patient or surrogate, discussions with consultants, evaluation of patient's response to treatment, examination of patient, obtaining history from patient or surrogate, ordering and performing treatments and interventions, ordering and review of laboratory studies, ordering and review of radiographic studies, pulse oximetry, re-evaluation of patient's condition and review of old charts     ____________________________________________   [place] /  MDM / ASSESSMENT AND PLAN / ED COURSE  As part of my medical decision making, I reviewed the following data within the electronic MEDICAL RECORD NUMBER   68 year old male presenting with above-stated history and physical exam concern for possible pneumonia, bronchitis, COVID-19 infection.  Chest x-ray revealed no evidence of pneumonia COVID-19 testing pending.  Patient received 2 duo nebs, 125 mg of Solu-Medrol IV and azithromycin 500 mg.  On reevaluation patient no longer tachypneic markedly improved expiratory wheezing.  Patient states "I feel much better".  Patient will be discharged home with albuterol inhaler azithromycin and prednisone.      ____________________________________________  FINAL CLINICAL IMPRESSION(S) / ED  DIAGNOSES  Final diagnoses:  Acute bronchitis, unspecified organism     MEDICATIONS GIVEN DURING THIS VISIT:  Medications  sodium chloride flush (NS) 0.9 % injection 3 mL (has no administration in time range)  methylPREDNISolone sodium succinate (SOLU-MEDROL) 125 mg/2 mL injection (has no administration in time range)  ipratropium-albuterol (DUONEB) 0.5-2.5 (3) MG/3ML nebulizer solution (has no administration in time range)     ED Discharge Orders    None      *Please note:  Nathan James was evaluated in Emergency Department on 06/10/2019 for the symptoms described in the history of present illness. He was evaluated in the context of the global COVID-19 pandemic, which necessitated consideration that the patient might be at risk for infection with the SARS-CoV-2 virus that causes COVID-19. Institutional protocols and algorithms that pertain to the evaluation of patients at risk for COVID-19 are in a state of rapid change based on information released by regulatory bodies including the CDC and federal and state organizations. These policies and algorithms were followed during the patient's care in the ED.  Some ED evaluations and interventions may be delayed as a result of limited staffing during the pandemic.*  Note:  This document was prepared using Dragon voice recognition software and may include unintentional dictation errors.   Gregor Hams, MD 06/11/19 [phone]

## 2019-06-11 LAB — SARS CORONAVIRUS 2 (TAT 6-24 HRS): SARS Coronavirus 2: NEGATIVE

## 2019-06-11 MED ORDER — AZITHROMYCIN 500 MG PO TABS: 500.0000 mg | ORAL_TABLET | Freq: Every day | ORAL | 0 refills | Status: AC

## 2019-06-11 MED ORDER — PREDNISONE 20 MG PO TABS: 60.0000 mg | ORAL_TABLET | Freq: Every day | ORAL | 0 refills | Status: AC

## 2019-06-11 MED ORDER — AZITHROMYCIN 500 MG PO TABS
500.0000 mg | ORAL_TABLET | Freq: Once | ORAL | Status: AC
  Administered 2019-06-11: 02:00:00 500 mg via ORAL
  Filled 2019-06-11: qty 1

## 2019-06-11 MED ORDER — ALBUTEROL SULFATE HFA 108 (90 BASE) MCG/ACT IN AERS: 2.0000 | INHALATION_SPRAY | Freq: Four times a day (QID) | RESPIRATORY_TRACT | 0 refills | Status: AC | PRN

## 2019-06-25 DIAGNOSIS — J40 Bronchitis, not specified as acute or chronic: Secondary | ICD-10-CM | POA: Diagnosis not present

## 2019-07-07 DIAGNOSIS — Z1211 Encounter for screening for malignant neoplasm of colon: Secondary | ICD-10-CM | POA: Diagnosis not present

## 2019-07-07 DIAGNOSIS — Z8709 Personal history of other diseases of the respiratory system: Secondary | ICD-10-CM | POA: Diagnosis not present

## 2019-07-07 DIAGNOSIS — Z01812 Encounter for preprocedural laboratory examination: Secondary | ICD-10-CM | POA: Diagnosis not present

## 2019-07-10 ENCOUNTER — Other Ambulatory Visit: Payer: Self-pay

## 2019-07-10 DIAGNOSIS — Z20822 Contact with and (suspected) exposure to covid-19: Secondary | ICD-10-CM

## 2019-07-14 LAB — NOVEL CORONAVIRUS, NAA: SARS-CoV-2, NAA: NOT DETECTED

## 2019-08-13 ENCOUNTER — Ambulatory Visit: Payer: Medicare HMO

## 2019-08-13 DIAGNOSIS — Z1211 Encounter for screening for malignant neoplasm of colon: Secondary | ICD-10-CM | POA: Diagnosis not present

## 2019-08-13 DIAGNOSIS — Z01812 Encounter for preprocedural laboratory examination: Secondary | ICD-10-CM | POA: Diagnosis not present

## 2019-08-13 DIAGNOSIS — Z01818 Encounter for other preprocedural examination: Secondary | ICD-10-CM | POA: Diagnosis not present

## 2019-08-14 ENCOUNTER — Encounter: Payer: Self-pay | Admitting: Urology

## 2019-08-18 DIAGNOSIS — K5730 Diverticulosis of large intestine without perforation or abscess without bleeding: Secondary | ICD-10-CM | POA: Diagnosis not present

## 2019-08-18 DIAGNOSIS — I10 Essential (primary) hypertension: Secondary | ICD-10-CM | POA: Diagnosis not present

## 2019-08-18 DIAGNOSIS — K219 Gastro-esophageal reflux disease without esophagitis: Secondary | ICD-10-CM | POA: Diagnosis not present

## 2019-08-18 DIAGNOSIS — K640 First degree hemorrhoids: Secondary | ICD-10-CM | POA: Diagnosis not present

## 2019-08-18 DIAGNOSIS — G4733 Obstructive sleep apnea (adult) (pediatric): Secondary | ICD-10-CM | POA: Diagnosis not present

## 2019-08-18 DIAGNOSIS — Z1211 Encounter for screening for malignant neoplasm of colon: Secondary | ICD-10-CM | POA: Diagnosis not present

## 2019-08-21 ENCOUNTER — Other Ambulatory Visit: Payer: Self-pay | Admitting: *Deleted

## 2019-08-21 DIAGNOSIS — C61 Malignant neoplasm of prostate: Secondary | ICD-10-CM

## 2019-08-25 ENCOUNTER — Other Ambulatory Visit: Payer: Self-pay

## 2019-08-25 ENCOUNTER — Ambulatory Visit (INDEPENDENT_AMBULATORY_CARE_PROVIDER_SITE_OTHER): Payer: Medicare HMO | Admitting: *Deleted

## 2019-08-25 ENCOUNTER — Inpatient Hospital Stay: Payer: Medicare HMO | Attending: Radiation Oncology

## 2019-08-25 DIAGNOSIS — C61 Malignant neoplasm of prostate: Secondary | ICD-10-CM

## 2019-08-25 LAB — PSA: Prostatic Specific Antigen: <0.01 ng/mL (ref 0.00–4.00)

## 2019-08-25 MED ORDER — LEUPROLIDE ACETATE (6 MONTH) 45 MG ~~LOC~~ KIT
45.0000 mg | PACK | Freq: Once | SUBCUTANEOUS | Status: AC
  Administered 2019-08-25: 45 mg via SUBCUTANEOUS

## 2019-08-25 NOTE — Progress Notes (Signed)
Lupron IM Injection   Due to Prostate Cancer patient is present today for a Lupron Injection.  Medication: Lupron 38month Dose: 45 mg  Location: left upper outer buttocks Lot: [id number]   Patient tolerated well, no complications were noted  Performed by: Gaspar Cola CMA  Follow up: 6 months

## 2019-08-27 DIAGNOSIS — J9601 Acute respiratory failure with hypoxia: Secondary | ICD-10-CM | POA: Diagnosis not present

## 2019-08-27 DIAGNOSIS — R05 Cough: Secondary | ICD-10-CM | POA: Diagnosis not present

## 2019-08-27 DIAGNOSIS — J9600 Acute respiratory failure, unspecified whether with hypoxia or hypercapnia: Secondary | ICD-10-CM | POA: Diagnosis not present

## 2019-08-27 DIAGNOSIS — J40 Bronchitis, not specified as acute or chronic: Secondary | ICD-10-CM | POA: Diagnosis not present

## 2019-08-27 DIAGNOSIS — C61 Malignant neoplasm of prostate: Secondary | ICD-10-CM | POA: Diagnosis not present

## 2019-08-27 DIAGNOSIS — F17210 Nicotine dependence, cigarettes, uncomplicated: Secondary | ICD-10-CM | POA: Diagnosis not present

## 2019-08-31 DIAGNOSIS — J441 Chronic obstructive pulmonary disease with (acute) exacerbation: Secondary | ICD-10-CM | POA: Diagnosis not present

## 2019-09-02 ENCOUNTER — Ambulatory Visit: Admission: RE | Admit: 2019-09-02 | Payer: Medicare HMO | Source: Ambulatory Visit | Admitting: Radiation Oncology

## 2019-11-05 ENCOUNTER — Ambulatory Visit: Payer: Medicare HMO | Attending: Internal Medicine

## 2019-11-05 DIAGNOSIS — C61 Malignant neoplasm of prostate: Secondary | ICD-10-CM | POA: Diagnosis not present

## 2019-11-05 DIAGNOSIS — F1190 Opioid use, unspecified, uncomplicated: Secondary | ICD-10-CM | POA: Diagnosis not present

## 2019-11-05 DIAGNOSIS — M48062 Spinal stenosis, lumbar region with neurogenic claudication: Secondary | ICD-10-CM | POA: Diagnosis not present

## 2019-11-05 DIAGNOSIS — Z23 Encounter for immunization: Secondary | ICD-10-CM

## 2019-11-05 DIAGNOSIS — E876 Hypokalemia: Secondary | ICD-10-CM | POA: Diagnosis not present

## 2019-11-05 NOTE — Progress Notes (Signed)
   Covid-19 Vaccination Clinic  Name:  Nathan James    MRN: [id number] DOB: 07/15/1951  11/05/2019  Nathan James was observed post Covid-19 immunization for 15 minutes without incident. He was provided with Vaccine Information Sheet and instruction to access the V-Safe system.   Nathan James was instructed to call 911 with any severe reactions post vaccine: Marland Kitchen Difficulty breathing  . Swelling of face and throat  . A fast heartbeat  . A bad rash all over body  . Dizziness and weakness   Immunizations Administered    Name Date Dose VIS Date Route   Pfizer COVID-19 Vaccine 11/05/2019  9:43 AM 0.3 mL 07/17/2019 Intramuscular   Manufacturer: [place]   Lot: [id number]   [place]: [id number]

## 2019-11-11 DIAGNOSIS — J449 Chronic obstructive pulmonary disease, unspecified: Secondary | ICD-10-CM | POA: Diagnosis not present

## 2019-11-11 DIAGNOSIS — I10 Essential (primary) hypertension: Secondary | ICD-10-CM | POA: Diagnosis not present

## 2019-11-11 DIAGNOSIS — M62838 Other muscle spasm: Secondary | ICD-10-CM | POA: Diagnosis not present

## 2019-11-11 DIAGNOSIS — M48061 Spinal stenosis, lumbar region without neurogenic claudication: Secondary | ICD-10-CM | POA: Diagnosis not present

## 2019-11-11 DIAGNOSIS — C61 Malignant neoplasm of prostate: Secondary | ICD-10-CM | POA: Diagnosis not present

## 2019-11-11 DIAGNOSIS — E876 Hypokalemia: Secondary | ICD-10-CM | POA: Diagnosis not present

## 2019-11-11 DIAGNOSIS — Z0000 Encounter for general adult medical examination without abnormal findings: Secondary | ICD-10-CM | POA: Diagnosis not present

## 2019-12-01 ENCOUNTER — Ambulatory Visit: Payer: Medicare HMO | Attending: Internal Medicine

## 2019-12-01 DIAGNOSIS — Z23 Encounter for immunization: Secondary | ICD-10-CM

## 2019-12-01 NOTE — Progress Notes (Signed)
   Covid-19 Vaccination Clinic  Name:  LENNELL FLAUGHER    MRN: [id number] DOB: [birth year]  12/01/2019  Mr. Christman was observed post Covid-19 immunization for 15 minutes without incident. He was provided with Vaccine Information Sheet and instruction to access the V-Safe system.   Mr. Carnevale was instructed to call 911 with any severe reactions post vaccine: Marland Kitchen Difficulty breathing  . Swelling of face and throat  . A fast heartbeat  . A bad rash all over body  . Dizziness and weakness   Immunizations Administered    Name Date Dose VIS Date Route   Pfizer COVID-19 Vaccine 12/01/2019 10:17 AM 0.3 mL 09/30/2018 Intramuscular   Manufacturer: [employer]   Lot: [id number]   [place]: [id number]

## 2019-12-12 ENCOUNTER — Ambulatory Visit: Payer: Medicare HMO

## 2020-02-01 ENCOUNTER — Telehealth: Payer: Self-pay

## 2020-02-01 NOTE — Telephone Encounter (Signed)
Incoming fax from [place] for [place], no PA required. EOC (episode of coverage) [id number]  [phone]

## 2020-02-22 ENCOUNTER — Ambulatory Visit (INDEPENDENT_AMBULATORY_CARE_PROVIDER_SITE_OTHER): Payer: Medicare HMO | Admitting: *Deleted

## 2020-02-22 ENCOUNTER — Other Ambulatory Visit: Payer: Self-pay

## 2020-02-22 DIAGNOSIS — C61 Malignant neoplasm of prostate: Secondary | ICD-10-CM

## 2020-02-22 MED ORDER — LEUPROLIDE ACETATE (6 MONTH) 45 MG ~~LOC~~ KIT
45.0000 mg | PACK | Freq: Once | SUBCUTANEOUS | Status: AC
  Administered 2020-02-22: 45 mg via SUBCUTANEOUS

## 2020-02-22 NOTE — Progress Notes (Signed)
Eligard SubQ Injection   Due to Prostate Cancer patient is present today for a Eligard Injection.  Medication: Eligard 6 month Dose: 45 mg  Location: left  Lot: [id number] Exp: 07/2021 Patient tolerated well, no complications were noted  Performed by: Gaspar Cola CMA   Per Dr. Diamantina Providence  patient is to continue therapy for 6 months . Patient's next follow up was scheduled for 6 months This appointment was scheduled using wheel and given to patient today along with reminder continue on Vitamin D 800-1000iu and Calium 1000-1200mg  daily while on Androgen Deprivation Therapy.  PA approval dates:

## 2020-02-25 ENCOUNTER — Ambulatory Visit (INDEPENDENT_AMBULATORY_CARE_PROVIDER_SITE_OTHER): Payer: Medicare HMO | Admitting: Urology

## 2020-02-25 ENCOUNTER — Encounter: Payer: Self-pay | Admitting: Urology

## 2020-02-25 ENCOUNTER — Other Ambulatory Visit: Payer: Self-pay

## 2020-02-25 VITALS — BP 109/70 | HR 72 | Ht 71.0 in | Wt 173.9 lb

## 2020-02-25 DIAGNOSIS — C61 Malignant neoplasm of prostate: Secondary | ICD-10-CM | POA: Diagnosis not present

## 2020-02-25 DIAGNOSIS — N529 Male erectile dysfunction, unspecified: Secondary | ICD-10-CM | POA: Diagnosis not present

## 2020-02-25 DIAGNOSIS — R350 Frequency of micturition: Secondary | ICD-10-CM

## 2020-02-25 LAB — BLADDER SCAN AMB NON-IMAGING

## 2020-02-25 MED ORDER — SILDENAFIL CITRATE 100 MG PO TABS: 100.0000 mg | ORAL_TABLET | Freq: Every day | ORAL | 11 refills | Status: DC | PRN

## 2020-02-25 NOTE — Patient Instructions (Signed)
1. Stop drinking fluids after 7pm, and urinate right before bed 2. Avoid mountain dew, sodas, diet drinks, tea, and coffee as these will irritate the bladder, especially overnight  Sildenafil tablets (Erectile Dysfunction) What is this medicine? SILDENAFIL (sil DEN a fil) is used to treat erection problems in men. This medicine may be used for other purposes; ask your health care provider or pharmacist if you have questions. COMMON BRAND NAME(S): Viagra What should I tell my health care provider before I take this medicine? They need to know if you have any of these conditions:  bleeding disorders  eye or vision problems, including a rare inherited eye disease called retinitis pigmentosa  anatomical deformation of the penis, Peyronie's disease, or history of priapism (painful and prolonged erection)  heart disease, angina, a history of heart attack, irregular heart beats, or other heart problems  high or low blood pressure  history of blood diseases, like sickle cell anemia or leukemia  history of stomach bleeding  kidney disease  liver disease  stroke  an unusual or allergic reaction to sildenafil, other medicines, foods, dyes, or preservatives  pregnant or trying to get pregnant  breast-feeding How should I use this medicine? Take this medicine by mouth with a glass of water. Follow the directions on the prescription label. The dose is usually taken 1 hour before sexual activity. You should not take the dose more than once per day. Do not take your medicine more often than directed. Talk to your pediatrician regarding the use of this medicine in children. This medicine is not used in children for this condition. Overdosage: If you think you have taken too much of this medicine contact a poison control center or emergency room at once. NOTE: This medicine is only for you. Do not share this medicine with others. What if I miss a dose? This does not apply. Do not take double  or extra doses. What may interact with this medicine? Do not take this medicine with any of the following medications:  cisapride  nitrates like amyl nitrite, isosorbide dinitrate, isosorbide mononitrate, nitroglycerin  riociguat This medicine may also interact with the following medications:  antiviral medicines for HIV or AIDS  bosentan  certain medicines for benign prostatic hyperplasia (BPH)  certain medicines for blood pressure  certain medicines for fungal infections like ketoconazole and itraconazole  cimetidine  erythromycin  rifampin This list may not describe all possible interactions. Give your health care provider a list of all the medicines, herbs, non-prescription drugs, or dietary supplements you use. Also tell them if you smoke, drink alcohol, or use illegal drugs. Some items may interact with your medicine. What should I watch for while using this medicine? If you notice any changes in your vision while taking this drug, call your doctor or health care professional as soon as possible. Stop using this medicine and call your health care provider right away if you have a loss of sight in one or both eyes. Contact your doctor or health care professional right away if you have an erection that lasts longer than 4 hours or if it becomes painful. This may be a sign of a serious problem and must be treated right away to prevent permanent damage. If you experience symptoms of nausea, dizziness, chest pain or arm pain upon initiation of sexual activity after taking this medicine, you should refrain from further activity and call your doctor or health care professional as soon as possible. Do not drink alcohol to excess (examples,  5 glasses of wine or 5 shots of whiskey) when taking this medicine. When taken in excess, alcohol can increase your chances of getting a headache or getting dizzy, increasing your heart rate or lowering your blood pressure. Using this medicine does  not protect you or your partner against HIV infection (the virus that causes AIDS) or other sexually transmitted diseases. What side effects may I notice from receiving this medicine? Side effects that you should report to your doctor or health care professional as soon as possible:  allergic reactions like skin rash, itching or hives, swelling of the face, lips, or tongue  breathing problems  changes in hearing  changes in vision  chest pain  fast, irregular heartbeat  prolonged or painful erection  seizures Side effects that usually do not require medical attention (report to your doctor or health care professional if they continue or are bothersome):  back pain  dizziness  flushing  headache  indigestion  muscle aches  nausea  stuffy or runny nose This list may not describe all possible side effects. Call your doctor for medical advice about side effects. You may report side effects to FDA at 1-800-FDA-1088. Where should I keep my medicine? Keep out of reach of children. Store at room temperature between 15 and 30 degrees C (59 and 86 degrees F). Throw away any unused medicine after the expiration date. NOTE: This sheet is a summary. It may not cover all possible information. If you have questions about this medicine, talk to your doctor, pharmacist, or health care provider.  2020 Elsevier/Gold Standard (2015-07-06 12:00:25)

## 2020-02-25 NOTE — Progress Notes (Signed)
   02/25/2020 12:40 PM   DELON REVELO 1951/01/30 [id number]  Reason for visit: Follow up high risk prostate cancer, nocturia, erectile dysfunction  HPI: I saw Mr. Nathan James back in urology clinic today for the above issues.  He is a comorbid 69 year old African-American male who was diagnosed with high risk prostate cancer in November 2019 for an elevated PSA of 6.7.  He underwent external beam radiation with Dr. Baruch Gouty, and started 2 years of ADT in January 2020(received last ADT injection 02/22/2020).  PSA has remained undetectable, and was most recently checked 08/25/2019.  He reports worsening nocturia over the last few months up to 4 times per night.  He denies any urinary complaints during the day.  He denies any gross hematuria or dysuria.  Urinalysis today is benign with 0-5 WBCs, 0-2 RBCs, no bacteria, nitrite negative, and PVR is normal at 50 mL.  He does drink [facility] in the evenings before bed.  He denies any lower extremity edema.  He is never been evaluated for sleep apnea, but he does snore.  We discussed behavioral strategies at length, and I think his nocturia is primarily secondary to his fluid intake in the evenings and [facility].  He also has had ongoing trouble with erectile dysfunction.  He was not able to get erections prior to undergoing radiation for his prostate cancer or starting ADT.  We have previously tried 20 mg sildenafil on demand, but he reports this did not improve his erections.  -Decrease fluid intake after 7 PM, avoid sodas and bladder irritants before bed and in the evening, urinate prior to bed -Consider sleep apnea evaluation of persistent nocturia -Trial of sildenafil 100 mg on demand for ED -RTC 6 months with PSA prior  Billey Co, MD  [facility] [address] [place],  [address] [phone]

## 2020-02-26 LAB — MICROSCOPIC EXAMINATION: Bacteria, UA: NONE SEEN

## 2020-02-26 LAB — URINALYSIS, COMPLETE
Bilirubin, UA: NEGATIVE
Glucose, UA: NEGATIVE
Ketones, UA: NEGATIVE
Leukocytes,UA: NEGATIVE
Nitrite, UA: NEGATIVE
Protein,UA: NEGATIVE
RBC, UA: NEGATIVE
Specific Gravity, UA: 1.025 (ref 1.005–1.030)
Urobilinogen, Ur: 0.2 mg/dL (ref 0.2–1.0)
pH, UA: 5.5 (ref 5.0–7.5)

## 2020-05-18 DIAGNOSIS — Z125 Encounter for screening for malignant neoplasm of prostate: Secondary | ICD-10-CM | POA: Diagnosis not present

## 2020-05-18 DIAGNOSIS — Z79899 Other long term (current) drug therapy: Secondary | ICD-10-CM | POA: Diagnosis not present

## 2020-05-18 DIAGNOSIS — C61 Malignant neoplasm of prostate: Secondary | ICD-10-CM | POA: Diagnosis not present

## 2020-05-25 DIAGNOSIS — Z0000 Encounter for general adult medical examination without abnormal findings: Secondary | ICD-10-CM | POA: Diagnosis not present

## 2020-05-25 DIAGNOSIS — M4800 Spinal stenosis, site unspecified: Secondary | ICD-10-CM | POA: Diagnosis not present

## 2020-05-25 DIAGNOSIS — F1190 Opioid use, unspecified, uncomplicated: Secondary | ICD-10-CM | POA: Insufficient documentation

## 2020-05-25 DIAGNOSIS — Z23 Encounter for immunization: Secondary | ICD-10-CM | POA: Diagnosis not present

## 2020-05-25 DIAGNOSIS — J449 Chronic obstructive pulmonary disease, unspecified: Secondary | ICD-10-CM | POA: Diagnosis not present
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
## 2020-08-10 ENCOUNTER — Other Ambulatory Visit: Payer: Self-pay

## 2020-08-10 ENCOUNTER — Other Ambulatory Visit: Payer: Medicare HMO

## 2020-08-10 DIAGNOSIS — C61 Malignant neoplasm of prostate: Secondary | ICD-10-CM

## 2020-08-11 LAB — PSA: Prostate Specific Ag, Serum: <0.1 ng/mL (ref 0.0–4.0)

## 2020-08-12 ENCOUNTER — Telehealth: Payer: Self-pay

## 2020-08-12 NOTE — Telephone Encounter (Signed)
2022: No PA required for Eligard. No Change in coverage/insurance.

## 2020-08-15 ENCOUNTER — Ambulatory Visit: Payer: Self-pay | Admitting: Urology

## 2020-08-17 ENCOUNTER — Encounter: Payer: Self-pay | Admitting: Urology

## 2020-08-17 ENCOUNTER — Ambulatory Visit: Payer: Medicare HMO | Admitting: Urology

## 2020-08-17 ENCOUNTER — Other Ambulatory Visit: Payer: Self-pay

## 2020-08-17 VITALS — BP 125/87 | Ht 71.0 in | Wt 168.9 lb

## 2020-08-17 DIAGNOSIS — C61 Malignant neoplasm of prostate: Secondary | ICD-10-CM

## 2020-08-17 DIAGNOSIS — N529 Male erectile dysfunction, unspecified: Secondary | ICD-10-CM

## 2020-08-17 DIAGNOSIS — R351 Nocturia: Secondary | ICD-10-CM

## 2020-08-17 MED ORDER — TADALAFIL 20 MG PO TABS: 20.0000 mg | ORAL_TABLET | Freq: Every day | ORAL | 11 refills | Status: AC | PRN

## 2020-08-17 NOTE — Patient Instructions (Signed)
Tadalafil tablets (Cialis) What is this medicine? TADALAFIL (tah DA la fil) is used to treat erection problems in men. It is also used for enlargement of the prostate gland in men, a condition called benign prostatic hyperplasia or BPH. This medicine improves urine flow and reduces BPH symptoms. This medicine can also treat both erection problems and BPH when they occur together. This medicine may be used for other purposes; ask your health care provider or pharmacist if you have questions. COMMON BRAND NAME(S): Adcirca, ALYQ, Cialis What should I tell my health care provider before I take this medicine? They need to know if you have any of these conditions:  bleeding disorders  eye or vision problems, including a rare inherited eye disease called retinitis pigmentosa  anatomical deformation of the penis, Peyronie's disease, or history of priapism (painful and prolonged erection)  heart disease, angina, a history of heart attack, irregular heart beats, or other heart problems  high or low blood pressure  history of blood diseases, like sickle cell anemia or leukemia  history of stomach bleeding  kidney disease  liver disease  stroke  an unusual or allergic reaction to tadalafil, other medicines, foods, dyes, or preservatives  pregnant or trying to get pregnant  breast-feeding How should I use this medicine? Take this medicine by mouth with a glass of water. Follow the directions on the prescription label. You may take this medicine with or without meals. When this medicine is used for erection problems, your doctor may prescribe it to be taken once daily or as needed. If you are taking the medicine as needed, you may be able to have sexual activity 30 minutes after taking it and for up to 36 hours after taking it. Whether you are taking the medicine as needed or once daily, you should not take more than one dose per day. If you are taking this medicine for symptoms of benign  prostatic hyperplasia (BPH) or to treat both BPH and an erection problem, take the dose once daily at about the same time each day. Do not take your medicine more often than directed. Talk to your pediatrician regarding the use of this medicine in children. Special care may be needed. Overdosage: If you think you have taken too much of this medicine contact a poison control center or emergency room at once. NOTE: This medicine is only for you. Do not share this medicine with others. What if I miss a dose? If you are taking this medicine as needed for erection problems, this does not apply. If you miss a dose while taking this medicine once daily for an erection problem, benign prostatic hyperplasia, or both, take it as soon as you remember, but do not take more than one dose per day. What may interact with this medicine? Do not take this medicine with any of the following medications:  nitrates like amyl nitrite, isosorbide dinitrate, isosorbide mononitrate, nitroglycerin  other medicines for erectile dysfunction like avanafil, sildenafil, vardenafil  other tadalafil products (Adcirca)  riociguat This medicine may also interact with the following medications:  certain drugs for high blood pressure  certain drugs for the treatment of HIV infection or AIDS  certain drugs used for fungal or yeast infections, like fluconazole, itraconazole, ketoconazole, and voriconazole  certain drugs used for seizures like carbamazepine, phenytoin, and phenobarbital  grapefruit juice  macrolide antibiotics like clarithromycin, erythromycin, troleandomycin  medicines for prostate problems  rifabutin, rifampin or rifapentine This list may not describe all possible interactions. Give your   or use illegal drugs. Some items may interact with your medicine. What should I watch for while using this medicine? If  you notice any changes in your vision while taking this drug, call your doctor or health care professional as soon as possible. Stop using this medicine and call your health care provider right away if you have a loss of sight in one or both eyes. Contact your doctor or health care professional right away if the erection lasts longer than 4 hours or if it becomes painful. This may be a sign of serious problem and must be treated right away to prevent permanent damage. If you experience symptoms of nausea, dizziness, chest pain or arm pain upon initiation of sexual activity after taking this medicine, you should refrain from further activity and call your doctor or health care professional as soon as possible. Do not drink alcohol to excess (examples, 5 glasses of wine or 5 shots of whiskey) when taking this medicine. When taken in excess, alcohol can increase your chances of getting a headache or getting dizzy, increasing your heart rate or lowering your blood pressure. Using this medicine does not protect you or your partner against HIV infection (the virus that causes AIDS) or other sexually transmitted diseases. What side effects may I notice from receiving this medicine? Side effects that you should report to your doctor or health care professional as soon as possible: allergic reactions like skin rash, itching or hives, swelling of the face, lips, or tongue breathing problems changes in hearing changes in vision chest pain fast, irregular heartbeat prolonged or painful erection seizures Side effects that usually do not require medical attention (report to your doctor or health care professional if they continue or are bothersome): back pain dizziness flushing headache indigestion muscle aches nausea stuffy or runny nose This list may not describe all possible side effects. Call your doctor for medical advice about side effects. You may report side effects to FDA at 1-800-FDA-1088. Where  should I keep my medicine? Keep out of the reach of children. Store at room temperature between 15 and 30 degrees C (59 and 86 degrees F). Throw away any unused medicine after the expiration date. NOTE: This sheet is a summary. It may not cover all possible information. If you have questions about this medicine, talk to your doctor, pharmacist, or health care provider.  2021 Elsevier/Gold Standard (2013-12-11 13:15:49)  

## 2020-08-17 NOTE — Progress Notes (Signed)
   08/17/2020 11:54 AM   Arnette Schaumann 1951-01-03 [id number]  Reason for visit: Follow up prostate cancer, ED, Nocturia  HPI: I saw Mr. Dickerson back in urology clinic today for the above issues.  He is a comorbid 70 year old African-American male who was diagnosed with high risk prostate cancer in November 2019 for an elevated PSA of 6.7.  He underwent external beam radiation with Dr. Baruch Gouty, and started 2 years of ADT in January 2020(received last ADT injection 02/22/2020).  PSA has remained undetectable, and was most recently checked 08/10/2020.  He continues to have some bothersome nocturia 3-4 times per night.  He was previously drinking [facility] before bed, and he has worked on cutting back on that.  With his hot flashes from the ADT, he does drink fluids overnight when he has sweats, but these have been improving over the last few weeks his ADT wears off.  He remains on Flomax nightly.  He denies any significant urinary symptoms during the day.  PVRs have been normal.  IPSS score today is 10, with quality of life mixed.  He also continues to have trouble with erections.  He had ED prior to undergoing radiation for prostate cancer.  He has not had significant improvement on 100 mg sildenafil.  I recommended a trial of Cialis, and risk benefits were discussed at length.  Continue Flomax, behavioral strategies discussed at length regarding nocturia, consider sleep apnea evaluation in the future if persistent Trial of Cialis 20 mg on demand for ED RTC 6 months with PSA   Billey Co, MD  [facility] [address] [place], [place] [address] [phone]

## 2020-10-02 DIAGNOSIS — M25551 Pain in right hip: Secondary | ICD-10-CM | POA: Diagnosis not present

## 2020-10-02 DIAGNOSIS — M7061 Trochanteric bursitis, right hip: Secondary | ICD-10-CM | POA: Diagnosis not present

## 2020-11-16 DIAGNOSIS — I714 Abdominal aortic aneurysm, without rupture: Secondary | ICD-10-CM | POA: Diagnosis not present

## 2020-11-16 DIAGNOSIS — E538 Deficiency of other specified B group vitamins: Secondary | ICD-10-CM | POA: Diagnosis not present

## 2020-11-16 DIAGNOSIS — M48062 Spinal stenosis, lumbar region with neurogenic claudication: Secondary | ICD-10-CM | POA: Diagnosis not present

## 2020-11-16 DIAGNOSIS — F1190 Opioid use, unspecified, uncomplicated: Secondary | ICD-10-CM | POA: Diagnosis not present

## 2020-11-16 DIAGNOSIS — M5116 Intervertebral disc disorders with radiculopathy, lumbar region: Secondary | ICD-10-CM | POA: Diagnosis not present

## 2020-11-23 DIAGNOSIS — R059 Cough, unspecified: Secondary | ICD-10-CM | POA: Diagnosis not present

## 2020-11-23 DIAGNOSIS — Z0000 Encounter for general adult medical examination without abnormal findings: Secondary | ICD-10-CM | POA: Diagnosis not present

## 2020-11-23 DIAGNOSIS — R053 Chronic cough: Secondary | ICD-10-CM | POA: Diagnosis not present

## 2020-11-23 DIAGNOSIS — Z125 Encounter for screening for malignant neoplasm of prostate: Secondary | ICD-10-CM | POA: Diagnosis not present

## 2020-11-23 DIAGNOSIS — Z8546 Personal history of malignant neoplasm of prostate: Secondary | ICD-10-CM | POA: Diagnosis not present

## 2020-11-23 DIAGNOSIS — D649 Anemia, unspecified: Secondary | ICD-10-CM | POA: Diagnosis not present

## 2020-11-23 DIAGNOSIS — D500 Iron deficiency anemia secondary to blood loss (chronic): Secondary | ICD-10-CM | POA: Diagnosis not present

## 2020-11-25 ENCOUNTER — Other Ambulatory Visit: Payer: Self-pay | Admitting: Internal Medicine

## 2020-11-25 DIAGNOSIS — I712 Thoracic aortic aneurysm, without rupture, unspecified: Secondary | ICD-10-CM

## 2020-11-25 DIAGNOSIS — R9389 Abnormal findings on diagnostic imaging of other specified body structures: Secondary | ICD-10-CM

## 2020-12-12 ENCOUNTER — Other Ambulatory Visit: Payer: Self-pay

## 2020-12-12 ENCOUNTER — Ambulatory Visit
Admission: RE | Admit: 2020-12-12 | Discharge: 2020-12-12 | Disposition: A | Discharge status: Home / Self Care | Payer: Medicare Other | Source: Ambulatory Visit | Attending: Internal Medicine | Admitting: Internal Medicine

## 2020-12-12 DIAGNOSIS — I712 Thoracic aortic aneurysm, without rupture, unspecified: Secondary | ICD-10-CM

## 2020-12-12 DIAGNOSIS — R9389 Abnormal findings on diagnostic imaging of other specified body structures: Secondary | ICD-10-CM | POA: Insufficient documentation

## 2020-12-12 MED ORDER — IOHEXOL 300 MG/ML  SOLN
75.0000 mL | Freq: Once | INTRAMUSCULAR | Status: AC | PRN
  Administered 2020-12-12: 75 mL via INTRAVENOUS

## 2020-12-14 IMAGING — CR DG CHEST 2V
2 series · 2 of 2 positions shown · non-contrast
Comparison: 05/02/2019

CLINICAL DATA: Shortness of breath

EXAM:
CHEST - 2 VIEW

[chest pa]
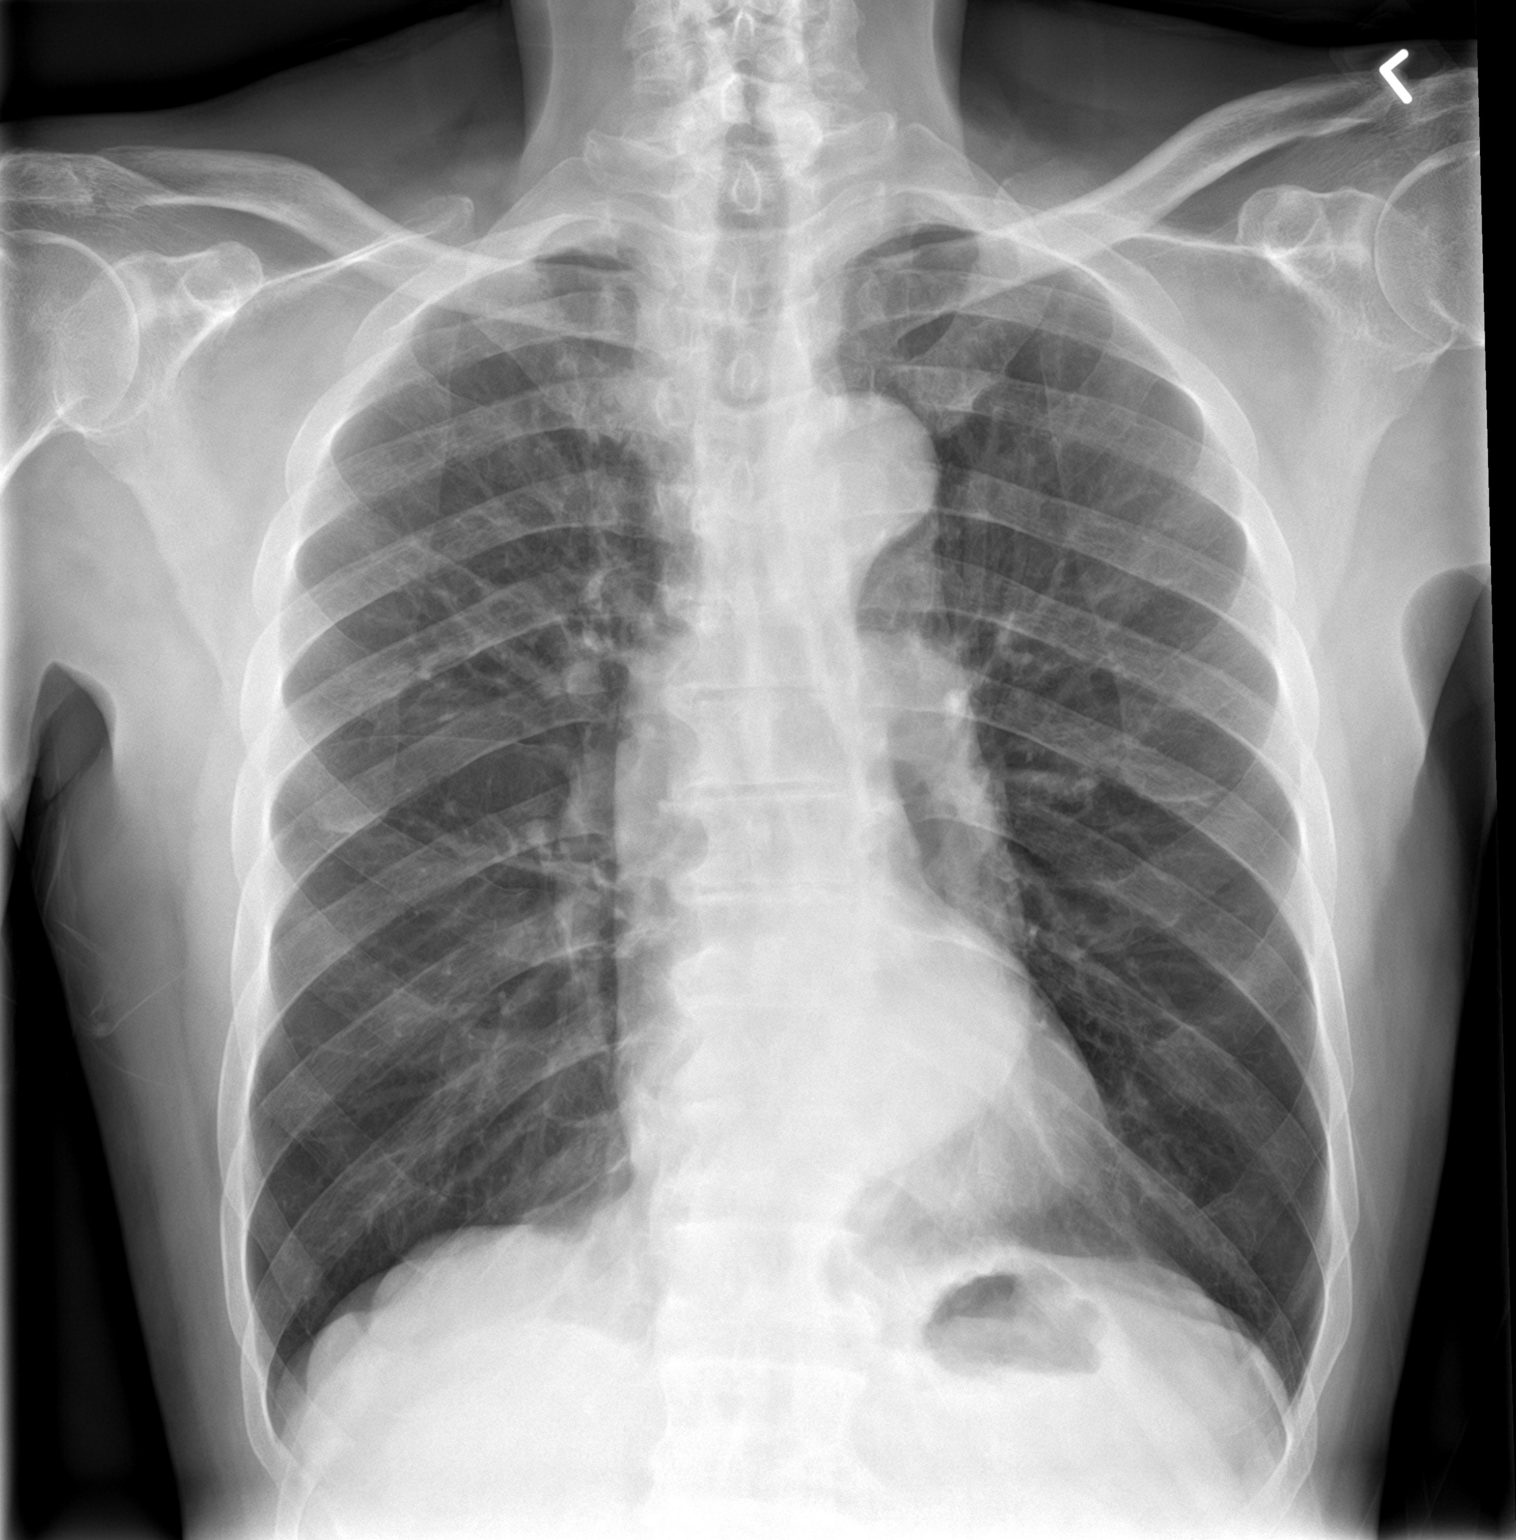

[chest lat]
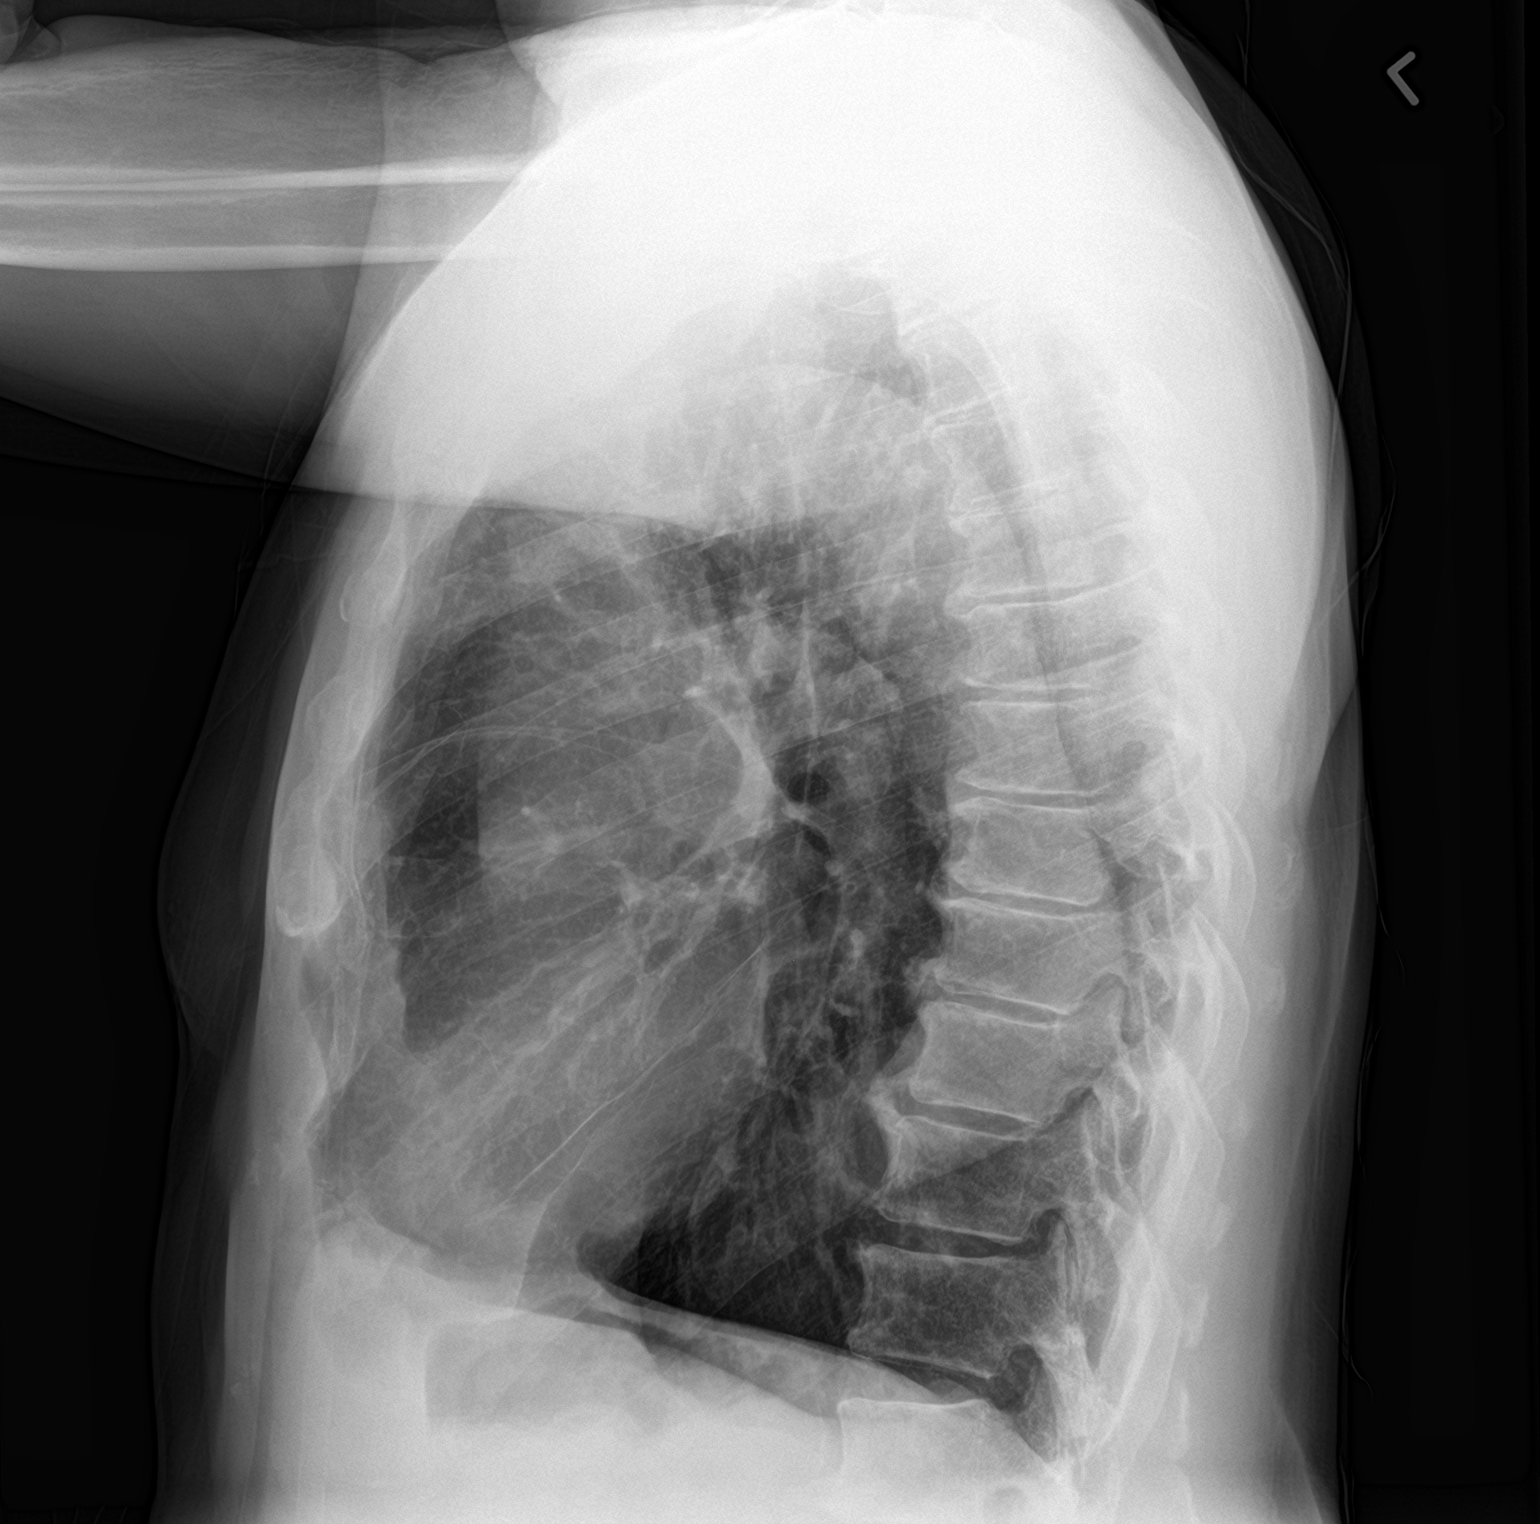

[2 of 2 positions shown; findings below may reference images not displayed]

FINDINGS: Hyperinflation. Mild apical scar. No consolidation or pleural
effusion. Stable cardiomediastinal silhouette. No pneumothorax.
IMPRESSION: No active cardiopulmonary disease.

## 2021-02-10 ENCOUNTER — Other Ambulatory Visit: Payer: Medicare Other

## 2021-02-10 ENCOUNTER — Other Ambulatory Visit: Payer: Self-pay

## 2021-02-10 DIAGNOSIS — C61 Malignant neoplasm of prostate: Secondary | ICD-10-CM

## 2021-02-11 LAB — PSA: Prostate Specific Ag, Serum: 0.2 ng/mL (ref 0.0–4.0)

## 2021-02-15 ENCOUNTER — Other Ambulatory Visit: Payer: Self-pay

## 2021-02-15 ENCOUNTER — Encounter: Payer: Self-pay | Admitting: Urology

## 2021-02-15 ENCOUNTER — Ambulatory Visit (INDEPENDENT_AMBULATORY_CARE_PROVIDER_SITE_OTHER): Payer: Medicare Other | Admitting: Urology

## 2021-02-15 VITALS — BP 137/81 | HR 73 | Ht 72.0 in | Wt 170.0 lb

## 2021-02-15 DIAGNOSIS — R351 Nocturia: Secondary | ICD-10-CM | POA: Diagnosis not present

## 2021-02-15 DIAGNOSIS — N529 Male erectile dysfunction, unspecified: Secondary | ICD-10-CM | POA: Diagnosis not present

## 2021-02-15 DIAGNOSIS — C61 Malignant neoplasm of prostate: Secondary | ICD-10-CM | POA: Diagnosis not present

## 2021-02-15 NOTE — Progress Notes (Signed)
   02/15/2021 3:05 PM   Nathan James 23-Nov-1950 [id number]  Reason for visit: Follow up prostate cancer, ED, nocturia  HPI: He is a comorbid 70 year old African-American male who was diagnosed with high risk prostate cancer in November 2019 for an elevated PSA of 6.7.  He underwent external beam radiation with Dr. Baruch Gouty, and started 2 years of ADT in January 2020(received last ADT injection 02/22/2020).  PSA has remained very low, and was most recently 0.2 on 02/10/2021 from undetectable 6 months ago.   He continues to have some nocturia 2-3 times per night, but also continues to drink [facility] and fluids in the evening.  He remains on Flomax nightly.  He denies any significant urinary symptoms during the day.  PVRs have been normal.     He also continues to have trouble with erections.  He had ED prior to undergoing radiation for prostate cancer.  He is currently taking Cialis 20 mg as needed with good results and is able to have satisfactory intercourse.  We discussed the need for ongoing PSA monitoring moving forward, and behavioral strategies discussed extensively regarding urinary symptoms  Continue Flomax and Cialis RTC 6 months with PSA prior   Billey Co, MD  [facility] [address], [place], [place] [address] [phone]

## 2021-03-29 ENCOUNTER — Other Ambulatory Visit: Payer: Self-pay | Admitting: Internal Medicine

## 2021-03-29 DIAGNOSIS — R918 Other nonspecific abnormal finding of lung field: Secondary | ICD-10-CM

## 2021-04-19 ENCOUNTER — Ambulatory Visit
Admission: RE | Admit: 2021-04-19 | Discharge: 2021-04-19 | Disposition: A | Discharge status: Home / Self Care | Payer: Medicare Other | Source: Ambulatory Visit | Attending: Internal Medicine | Admitting: Internal Medicine

## 2021-04-19 ENCOUNTER — Other Ambulatory Visit: Payer: Self-pay

## 2021-04-19 DIAGNOSIS — R918 Other nonspecific abnormal finding of lung field: Secondary | ICD-10-CM | POA: Insufficient documentation

## 2021-04-19 LAB — POCT I-STAT CREATININE: Creatinine, Ser: 1 mg/dL (ref 0.61–1.24)

## 2021-04-19 MED ORDER — IOHEXOL 350 MG/ML SOLN
75.0000 mL | Freq: Once | INTRAVENOUS | Status: AC | PRN
  Administered 2021-04-19: 75 mL via INTRAVENOUS

## 2021-04-21 ENCOUNTER — Encounter: Payer: Self-pay | Admitting: *Deleted

## 2021-04-21 DIAGNOSIS — R918 Other nonspecific abnormal finding of lung field: Secondary | ICD-10-CM

## 2021-04-21 DIAGNOSIS — N2889 Other specified disorders of kidney and ureter: Secondary | ICD-10-CM

## 2021-04-21 NOTE — Progress Notes (Signed)
Referral received for lung mass and renal mass. Per Dr. Grayland Ormond, pt will need PET scan and abdominal MRI for further workup. Orders placed and pt will be called once scheduled.

## 2021-04-23 ENCOUNTER — Ambulatory Visit
Admission: RE | Admit: 2021-04-23 | Discharge: 2021-04-23 | Disposition: A | Discharge status: Home / Self Care | Payer: Medicare Other | Source: Ambulatory Visit | Attending: Oncology | Admitting: Oncology

## 2021-04-23 ENCOUNTER — Other Ambulatory Visit: Payer: Self-pay

## 2021-04-23 DIAGNOSIS — N2889 Other specified disorders of kidney and ureter: Secondary | ICD-10-CM | POA: Insufficient documentation

## 2021-04-23 MED ORDER — GADOBUTROL 1 MMOL/ML IV SOLN
8.0000 mL | Freq: Once | INTRAVENOUS | Status: AC | PRN
  Administered 2021-04-23: 8 mL via INTRAVENOUS

## 2021-04-24 ENCOUNTER — Other Ambulatory Visit: Payer: Self-pay | Admitting: Oncology

## 2021-04-27 ENCOUNTER — Other Ambulatory Visit: Payer: Self-pay

## 2021-04-27 ENCOUNTER — Other Ambulatory Visit: Payer: Medicare Other

## 2021-04-27 ENCOUNTER — Ambulatory Visit
Admission: RE | Admit: 2021-04-27 | Discharge: 2021-04-27 | Disposition: A | Discharge status: Home / Self Care | Payer: Medicare Other | Source: Ambulatory Visit | Attending: Oncology | Admitting: Oncology

## 2021-04-27 DIAGNOSIS — R918 Other nonspecific abnormal finding of lung field: Secondary | ICD-10-CM | POA: Diagnosis not present

## 2021-04-27 DIAGNOSIS — N2889 Other specified disorders of kidney and ureter: Secondary | ICD-10-CM | POA: Insufficient documentation

## 2021-04-27 DIAGNOSIS — I2510 Atherosclerotic heart disease of native coronary artery without angina pectoris: Secondary | ICD-10-CM | POA: Diagnosis not present

## 2021-04-27 DIAGNOSIS — I700 Atherosclerosis of aorta: Secondary | ICD-10-CM | POA: Diagnosis not present

## 2021-04-27 LAB — GLUCOSE, CAPILLARY: Glucose-Capillary: 100 mg/dL — ABNORMAL HIGH (ref 70–99)

## 2021-04-27 MED ORDER — FLUDEOXYGLUCOSE F - 18 (FDG) INJECTION
8.9900 | Freq: Once | INTRAVENOUS | Status: AC | PRN
  Administered 2021-04-27: 8.99 via INTRAVENOUS

## 2021-04-27 NOTE — Progress Notes (Signed)
Tumor Board Documentation  ASHLAND WISEMAN was presented by Dr Grayland Ormond at our Tumor Board on 04/27/2021, which included representatives from medical oncology, radiation oncology, internal medicine, navigation, pathology, surgical, pharmacy, pulmonology, nutrition, research, palliative care, radiology.  Naithan currently presents as a new patient, for discussion with history of the following treatments: neoadjuvant radiation, active survellience.  Additionally, we reviewed previous medical and familial history, history of present illness, and recent lab results along with all available histopathologic and imaging studies. The tumor board considered available treatment options and made the following recommendations: Additional screening (PET scan, MRI Renal area 1 year) Refer to Pulmanology  The following procedures/referrals were also placed: No orders of the defined types were placed in this encounter.   Clinical Trial Status: not discussed   Staging used: To be determined AJCC Staging:       Group: Lung Mass   National site-specific guidelines   were discussed with respect to the case.  Tumor board is a meeting of clinicians from various specialty areas who evaluate and discuss patients for whom a multidisciplinary approach is being considered. Final determinations in the plan of care are those of the provider(s). The responsibility for follow up of recommendations given during tumor board is that of the provider.   Today's extended care, comprehensive team conference, Bronsyn was not present for the discussion and was not examined.   Multidisciplinary Tumor Board is a multidisciplinary case peer review process.  Decisions discussed in the Multidisciplinary Tumor Board reflect the opinions of the specialists present at the conference without having examined the patient.  Ultimately, treatment and diagnostic decisions rest with the primary provider(s) and the patient.

## 2021-04-29 DIAGNOSIS — N2889 Other specified disorders of kidney and ureter: Secondary | ICD-10-CM | POA: Insufficient documentation

## 2021-04-29 NOTE — Progress Notes (Deleted)
[facility]  [phone] [phone]) [phone]  ID: Nathan James OB: 10/21/1950  MR#: [id number]  CSN#:[id number]  Patient Care Team: Rusty Aus, MD as PCP - General (Internal Medicine) Telford Nab, RN as Oncology Nurse Navigator  CHIEF COMPLAINT: Left kidney mass  INTERVAL HISTORY: ***  REVIEW OF SYSTEMS:   ROS  As per HPI. Otherwise, a complete review of systems is negative.  PAST MEDICAL HISTORY: Past Medical History:  Diagnosis Date   Hypertension    Prostate cancer ([place]) 2020   Rad tx's.     PAST SURGICAL HISTORY: Past Surgical History:  Procedure Laterality Date   BACK SURGERY      FAMILY HISTORY: No family history on file.  ADVANCED DIRECTIVES (Y/N):  N  HEALTH MAINTENANCE: Social History   Tobacco Use   Smoking status: Every Day    Packs/day: 0.75    Types: Cigarettes   Smokeless tobacco: Never  Substance Use Topics   Alcohol use: Yes   Drug use: Never     Colonoscopy:  PAP:  Bone density:  Lipid panel:  Allergies  Allergen Reactions   Atenolol Other (See Comments)    Questionable.    Current Outpatient Medications  Medication Sig Dispense Refill   albuterol (VENTOLIN HFA) 108 (90 Base) MCG/ACT inhaler Inhale 2 puffs into the lungs every 6 (six) hours as needed for wheezing or shortness of breath. 1 g 0   amLODipine (NORVASC) 10 MG tablet Take 10 mg by mouth daily.  3   Cholecalciferol 50 MCG (2000 UT) TABS Take by mouth.     metoprolol succinate (TOPROL-XL) 100 MG 24 hr tablet TAKE 1 TABLET BY MOUTH EVERY DAY     tadalafil (CIALIS) 20 MG tablet Take 1 tablet (20 mg total) by mouth daily as needed for erectile dysfunction. 15 tablet 11   tamsulosin (FLOMAX) 0.4 MG CAPS capsule Take 1 capsule (0.4 mg total) by mouth daily after supper. 30 capsule 12   No current facility-administered medications for this visit.    OBJECTIVE: There were no vitals filed for this visit.   There is no height or weight on  file to calculate BMI.    ECOG FS:{CHL ONC [id number]  General: Well-developed, well-nourished, no acute distress. Eyes: Pink conjunctiva, anicteric sclera. HEENT: Normocephalic, moist mucous membranes. Lungs: No audible wheezing or coughing. Heart: Regular rate and rhythm. Abdomen: Soft, nontender, no obvious distention. Musculoskeletal: No edema, cyanosis, or clubbing. Neuro: Alert, answering all questions appropriately. Cranial nerves grossly intact. Skin: No rashes or petechiae noted. Psych: Normal affect. Lymphatics: No cervical, calvicular, axillary or inguinal LAD.   LAB RESULTS:  Lab Results  Component Value Date   NA 141 06/10/2019   K 4.7 06/10/2019   CL 103 06/10/2019   CO2 29 06/10/2019   GLUCOSE 105 (H) 06/10/2019   BUN 19 06/10/2019   CREATININE 1.00 04/19/2021   CALCIUM 9.6 06/10/2019   GFRNONAA >60 06/10/2019   GFRAA >60 06/10/2019    Lab Results  Component Value Date   WBC 4.9 06/10/2019   HGB 14.1 06/10/2019   HCT 43.2 06/10/2019   MCV 89.4 06/10/2019   PLT 143 (L) 06/10/2019     STUDIES: CT CHEST W CONTRAST  Result Date: 04/19/2021 CLINICAL DATA:  Follow-up chest CT done 12/13/2020. Shortness of breath with exertion for 6 months EXAM: CT CHEST WITH CONTRAST TECHNIQUE: Multidetector CT imaging of the chest was performed during intravenous contrast administration. CONTRAST:  32mL OMNIPAQUE IOHEXOL 350 MG/ML SOLN COMPARISON:  Chest radiograph 06/10/2019, chest CT 12/12/2020 FINDINGS: Cardiovascular: The heart size is stable. There is no pericardial effusion. Extensive coronary artery calcifications are again seen. The thoracic aorta has a tortuous course. The proximal arch measures approximate 3.5 cm in the sagittal plane, unchanged. Mediastinum/Nodes: There are subcentimeter hypodense thyroid nodules on the left, unchanged since 2015. The esophagus is grossly unremarkable. There is no mediastinal, axillary, or hilar lymphadenopathy. Lungs/Pleura: There  is debris in the trachea and main bronchi. There are areas of mucoid impaction in some lower lobe bronchi (for example 2-118), with associated bronchial wall thickening. There is a background of moderate emphysema. There is a 2.7 cm x 1.9 cm by 1.9 cm hypodense lesion abutting the right major fissure. A 5 mm nodule medial right apex is unchanged (2-27). There is no focal consolidation. There is biapical pleural-parenchymal scarring. There is no pleural effusion or pneumothorax. Upper Abdomen: Gallstones are noted without evidence of acute cholecystitis. There is a hypodense left renal lesion which is indeterminate by Hounsfield units measuring up to 2.0 cm, increased in conspicuity compared to the prior study. Musculoskeletal: There is no acute osseous abnormality or aggressive osseous lesion. Lumbar spine fusion hardware is partially imaged. IMPRESSION: 1. 2.7 cm lesion abutting the right major fissure is new since 12/12/2020, suspicious for a solid nodule, either primary lung origin or metastsatic disease given below described suspicious renal lesion. Recommend PET/CT for further evaluation. 2. Indeterminate 2.0 cm left renal lesion is increased in conspicuity since 12/12/2020. Recommend MRI of the abdomen with and without contrast for characterization. 3. Debris in the bronchi with areas of mucoid impaction and associated bronchial wall thickening in the distal airways. Correlate with any signs or symptoms of aspiration. 4. Cholelithiasis. 5. Aortic Atherosclerosis (ICD10-I70.0) and Emphysema (ICD10-J43.9). These results will be called to the ordering clinician or representative by the Radiologist Assistant, and communication documented in the PACS or [employer]. Electronically Signed   By: Valetta Mole M.D.   On: 04/19/2021 10:42   MR Abdomen W Wo Contrast  Result Date: 04/24/2021 CLINICAL DATA:  Evaluate renal mass EXAM: MRI ABDOMEN WITHOUT AND WITH CONTRAST TECHNIQUE: Multiplanar multisequence MR  imaging of the abdomen was performed both before and after the administration of intravenous contrast. CONTRAST:  63mL GADAVIST GADOBUTROL 1 MMOL/ML IV SOLN COMPARISON:  CT chest dated April 19, 2021; CT abdomen and pelvis dated July 31, 2018 FINDINGS: Lower chest: Nodule of the right lower lobe measuring up to 2.2 cm. Hepatobiliary: No mass or other parenchymal abnormality identified. Cholelithiasis no biliary ductal dilation. Pancreas: No mass, inflammatory changes, or other parenchymal abnormality identified. Spleen:  Within normal limits in size and appearance. Adrenals/Urinary Tract: Cystic lesion off the upper pole of the left kidney measuring 2.4 by 2.1 cm on series 9, image 13 with nodular enhancing septate. Areas of T1 hyperintensity are seen within the lesion, likely due to hemorrhage. Simple cyst the lower pole of the right kidney. Stomach/Bowel: Visualized portions within the abdomen are unremarkable. Vascular/Lymphatic: No pathologically enlarged lymph nodes identified. Infrarenal abdominal aortic aneurysm measures up to 3.1 cm. Other:  None. Musculoskeletal: Posterior fusion hardware of the lumbosacral spine. T1 hyperintense lesion of the right iliac bone, unchanged compared to prior Dec 09, 2018 lumbar spine MRI unlikely a graft harvest site. No suspicious bone lesions identified. IMPRESSION: Cystic lesion about the upper pole of the left kidney with nodular enhancing septa, concerning for RCC. Indeterminate nodule of the right lower lobe. Recommend attention on scheduled PET-CT. Abdominal aortic aneurysm  measures up to 3.1 cm. Recommend follow-up ultrasound every 3 years. This recommendation follows ACR consensus guidelines: White Paper of the ACR Incidental Findings Committee II on Vascular Findings. J Am Coll Radiol 2013; 10:789-794. Cholelithiasis. Aortic Atherosclerosis (ICD10-I70.0). Electronically Signed   By: Yetta Glassman M.D.   On: 04/24/2021 11:45   NM PET Image Initial (PI)  Skull Base To Thigh  Result Date: 04/29/2021 CLINICAL DATA:  Initial treatment strategy for lung nodule. EXAM: NUCLEAR MEDICINE PET SKULL BASE TO THIGH TECHNIQUE: 9.0 mCi F-18 FDG was injected intravenously. Full-ring PET imaging was performed from the skull base to thigh after the radiotracer. CT data was obtained and used for attenuation correction and anatomic localization. Fasting blood glucose: 100 mg/dl COMPARISON:  MRI 04/23/2021, CT 04/19/2021 FINDINGS: Mediastinal blood pool activity: SUV max 1.8 Liver activity: SUV max NA NECK: No hypermetabolic lymph nodes in the neck. Incidental CT findings: none CHEST: Large ovoid smoothly marginated nodule in the RIGHT lower lobe measures 2.6 cm (image 136) and has no significant metabolic activity (SUV max equal 1.4). This is less activity than blood pool activity. No additional pulmonary nodules. No hypermetabolic or enlarged mediastinal lymph nodes. Coronary artery calcification and aortic atherosclerotic calcification. Incidental CT findings: none ABDOMEN/PELVIS: The lesion of concern in the LEFT renal cortex also has no associated metabolic activity (image 800 of the fused data set). No hypermetabolic lymph nodes in the abdomen pelvis. No abnormal activity in the liver. Adrenal glands are normal. Incidental CT findings: none SKELETON: No focal hypermetabolic activity to suggest skeletal metastasis. Incidental CT findings: Posterior lumbar fusion. IMPRESSION: 1. The RIGHT lower lobe pulmonary nodule has no significant metabolic activity. This would suggest benign etiology; however, the LEFT cystic renal lesion of concern also has no metabolic activity and therefore if pulmonary nodule is of renal origin a non metabolic active malignancy is a consideration. 2. No metabolic activity associated with the LEFT cystic renal cortical lesion of concern. Electronically Signed   By: Suzy Bouchard M.D.   On: 04/29/2021 10:43    ASSESSMENT: Left kidney mass  PLAN:     Left kidney mass:  Patient expressed understanding and was in agreement with this plan. He also understands that He can call clinic at any time with any questions, concerns, or complaints.   Cancer Staging No matching staging information was found for the patient.  Lloyd Huger, MD   04/29/2021 6:58 PM

## 2021-05-01 ENCOUNTER — Other Ambulatory Visit: Payer: Self-pay | Admitting: Oncology

## 2021-05-02 ENCOUNTER — Inpatient Hospital Stay: Payer: Medicare Other

## 2021-05-02 ENCOUNTER — Inpatient Hospital Stay: Payer: Medicare Other | Admitting: Oncology

## 2021-05-02 DIAGNOSIS — N2889 Other specified disorders of kidney and ureter: Secondary | ICD-10-CM

## 2021-05-04 ENCOUNTER — Encounter: Payer: Self-pay | Admitting: *Deleted

## 2021-05-04 ENCOUNTER — Other Ambulatory Visit: Payer: Self-pay

## 2021-05-04 ENCOUNTER — Inpatient Hospital Stay: Payer: Medicare Other

## 2021-05-04 ENCOUNTER — Inpatient Hospital Stay: Payer: Medicare Other | Attending: Oncology | Admitting: Oncology

## 2021-05-04 ENCOUNTER — Other Ambulatory Visit: Payer: Medicare Other

## 2021-05-04 VITALS — BP 132/83 | HR 79 | Temp 97.9°F | Resp 18 | Wt 165.0 lb

## 2021-05-04 DIAGNOSIS — N2889 Other specified disorders of kidney and ureter: Secondary | ICD-10-CM

## 2021-05-04 DIAGNOSIS — R918 Other nonspecific abnormal finding of lung field: Secondary | ICD-10-CM | POA: Diagnosis not present

## 2021-05-04 DIAGNOSIS — F17210 Nicotine dependence, cigarettes, uncomplicated: Secondary | ICD-10-CM | POA: Insufficient documentation

## 2021-05-04 DIAGNOSIS — R630 Anorexia: Secondary | ICD-10-CM | POA: Insufficient documentation

## 2021-05-04 DIAGNOSIS — R634 Abnormal weight loss: Secondary | ICD-10-CM | POA: Diagnosis not present

## 2021-05-04 NOTE — Progress Notes (Signed)
Tumor Board Documentation  Nathan James was presented by Dr Dessie Coma at our Tumor Board on 05/04/2021, which included representatives from medical oncology, surgical, radiology, pathology, navigation, radiation oncology, internal medicine, research, genetics, pharmacy, pulmonology.  Nathan James currently presents as a new patient, for discussion with history of the following treatments: active survellience.  Additionally, we reviewed previous medical and familial history, history of present illness, and recent lab results along with all available histopathologic and imaging studies. The tumor board considered available treatment options and made the following recommendations: Active surveillance (Repeat CT 6 months to 1 year and if still stable, no further fu needed)    The following procedures/referrals were also placed: No orders of the defined types were placed in this encounter.   Clinical Trial Status: not discussed   Staging used: Not Applicable AJCC Staging:       Group: Lung Nodule   National site-specific guidelines   were discussed with respect to the case.  Tumor board is a meeting of clinicians from various specialty areas who evaluate and discuss patients for whom a multidisciplinary approach is being considered. Final determinations in the plan of care are those of the provider(s). The responsibility for follow up of recommendations given during tumor board is that of the provider.   Today's extended care, comprehensive team conference, Nathan James was not present for the discussion and was not examined.   Multidisciplinary Tumor Board is a multidisciplinary case peer review process.  Decisions discussed in the Multidisciplinary Tumor Board reflect the opinions of the specialists present at the conference without having examined the patient.  Ultimately, treatment and diagnostic decisions rest with the primary provider(s) and the patient.

## 2021-05-04 NOTE — Progress Notes (Signed)
Pt states he has lost approximately 15 lbs "since the start of this problem." Unable to give specific time frame. Pt endorses some dizziness on occasion.

## 2021-05-05 NOTE — Progress Notes (Signed)
[place]  Telephone:(336) [phone] Fax:(336) [phone]  ID: Nathan James OB: 09-Aug-1950  MR#: [id number]  JKK#:[id number]  Patient Care Team: Rusty Aus, MD as PCP - General (Internal Medicine) Telford Nab, RN as Oncology Nurse Navigator  CHIEF COMPLAINT: Left kidney mass  INTERVAL HISTORY: Patient is a 70 year old male who was noted to have a lung mass and EKG and recent imaging.  He complains of a poor appetite and weight loss, but otherwise feels well.  He has no neurologic complaints.  He denies any recent fevers or illnesses.  He has no chest pain, shortness of breath, cough, or hemoptysis.  He denies any nausea, vomiting, constipation, or diarrhea.  He has no urinary complaints.  Patient offers no further specific complaints today.  REVIEW OF SYSTEMS:   Review of Systems  Constitutional:  Positive for malaise/fatigue and weight loss. Negative for fever.  Respiratory: Negative.  Negative for cough, hemoptysis and shortness of breath.   Cardiovascular: Negative.  Negative for chest pain and leg swelling.  Gastrointestinal: Negative.  Negative for abdominal pain.  Genitourinary: Negative.  Negative for dysuria.  Musculoskeletal: Negative.  Negative for back pain.  Skin: Negative.  Negative for rash.  Neurological:  Positive for weakness. Negative for dizziness, focal weakness and headaches.  Psychiatric/Behavioral: Negative.  The patient is not nervous/anxious.    As per HPI. Otherwise, a complete review of systems is negative.  PAST MEDICAL HISTORY: Past Medical History:  Diagnosis Date   Hypertension    Prostate cancer ([place]) 2020   Rad tx's.     PAST SURGICAL HISTORY: Past Surgical History:  Procedure Laterality Date   BACK SURGERY      FAMILY HISTORY: No family history on file.  ADVANCED DIRECTIVES (Y/N):  N  HEALTH MAINTENANCE: Social History   Tobacco Use   Smoking status: Every Day    Packs/day: 0.75    Types: Cigarettes    Smokeless tobacco: Never  Substance Use Topics   Alcohol use: Yes   Drug use: Never     Colonoscopy:  PAP:  Bone density:  Lipid panel:  Allergies  Allergen Reactions   Atenolol Other (See Comments)    Questionable.    Current Outpatient Medications  Medication Sig Dispense Refill   albuterol (VENTOLIN HFA) 108 (90 Base) MCG/ACT inhaler Inhale 2 puffs into the lungs every 6 (six) hours as needed for wheezing or shortness of breath. 1 g 0   Cholecalciferol 50 MCG (2000 UT) TABS Take by mouth.     metoprolol succinate (TOPROL-XL) 100 MG 24 hr tablet TAKE 1 TABLET BY MOUTH EVERY DAY     tadalafil (CIALIS) 20 MG tablet Take 1 tablet (20 mg total) by mouth daily as needed for erectile dysfunction. 15 tablet 11   tamsulosin (FLOMAX) 0.4 MG CAPS capsule Take 1 capsule (0.4 mg total) by mouth daily after supper. 30 capsule 12   amLODipine (NORVASC) 10 MG tablet Take 10 mg by mouth daily. (Patient not taking: Reported on 05/04/2021)  3   No current facility-administered medications for this visit.    OBJECTIVE: Vitals:   05/04/21 0922  BP: 132/83  Pulse: 79  Resp: 18  Temp: 97.9 F (36.6 C)  SpO2: 99%     Body mass index is 22.38 kg/m.    ECOG FS:1 - Symptomatic but completely ambulatory  General: Well-developed, well-nourished, no acute distress. Eyes: Pink conjunctiva, anicteric sclera. HEENT: Normocephalic, moist mucous membranes. Lungs: No audible wheezing or coughing. Heart: Regular rate and  rhythm. Abdomen: Soft, nontender, no obvious distention. Musculoskeletal: No edema, cyanosis, or clubbing. Neuro: Alert, answering all questions appropriately. Cranial nerves grossly intact. Skin: No rashes or petechiae noted. Psych: Normal affect. Lymphatics: No cervical, calvicular, axillary or inguinal LAD.   LAB RESULTS:  Lab Results  Component Value Date   NA 141 06/10/2019   K 4.7 06/10/2019   CL 103 06/10/2019   CO2 29 06/10/2019   GLUCOSE 105 (H) 06/10/2019   BUN  19 06/10/2019   CREATININE 1.00 04/19/2021   CALCIUM 9.6 06/10/2019   GFRNONAA >60 06/10/2019   GFRAA >60 06/10/2019    Lab Results  Component Value Date   WBC 4.9 06/10/2019   HGB 14.1 06/10/2019   HCT 43.2 06/10/2019   MCV 89.4 06/10/2019   PLT 143 (L) 06/10/2019     STUDIES: CT CHEST W CONTRAST  Result Date: 04/19/2021 CLINICAL DATA:  Follow-up chest CT done 12/13/2020. Shortness of breath with exertion for 6 months EXAM: CT CHEST WITH CONTRAST TECHNIQUE: Multidetector CT imaging of the chest was performed during intravenous contrast administration. CONTRAST:  74mL OMNIPAQUE IOHEXOL 350 MG/ML SOLN COMPARISON:  Chest radiograph 06/10/2019, chest CT 12/12/2020 FINDINGS: Cardiovascular: The heart size is stable. There is no pericardial effusion. Extensive coronary artery calcifications are again seen. The thoracic aorta has a tortuous course. The proximal arch measures approximate 3.5 cm in the sagittal plane, unchanged. Mediastinum/Nodes: There are subcentimeter hypodense thyroid nodules on the left, unchanged since 2015. The esophagus is grossly unremarkable. There is no mediastinal, axillary, or hilar lymphadenopathy. Lungs/Pleura: There is debris in the trachea and main bronchi. There are areas of mucoid impaction in some lower lobe bronchi (for example 2-118), with associated bronchial wall thickening. There is a background of moderate emphysema. There is a 2.7 cm x 1.9 cm by 1.9 cm hypodense lesion abutting the right major fissure. A 5 mm nodule medial right apex is unchanged (2-27). There is no focal consolidation. There is biapical pleural-parenchymal scarring. There is no pleural effusion or pneumothorax. Upper Abdomen: Gallstones are noted without evidence of acute cholecystitis. There is a hypodense left renal lesion which is indeterminate by Hounsfield units measuring up to 2.0 cm, increased in conspicuity compared to the prior study. Musculoskeletal: There is no acute osseous  abnormality or aggressive osseous lesion. Lumbar spine fusion hardware is partially imaged. IMPRESSION: 1. 2.7 cm lesion abutting the right major fissure is new since 12/12/2020, suspicious for a solid nodule, either primary lung origin or metastsatic disease given below described suspicious renal lesion. Recommend PET/CT for further evaluation. 2. Indeterminate 2.0 cm left renal lesion is increased in conspicuity since 12/12/2020. Recommend MRI of the abdomen with and without contrast for characterization. 3. Debris in the bronchi with areas of mucoid impaction and associated bronchial wall thickening in the distal airways. Correlate with any signs or symptoms of aspiration. 4. Cholelithiasis. 5. Aortic Atherosclerosis (ICD10-I70.0) and Emphysema (ICD10-J43.9). These results will be called to the ordering clinician or representative by the Radiologist Assistant, and communication documented in the PACS or [employer]. Electronically Signed   By: Valetta Mole M.D.   On: 04/19/2021 10:42   MR Abdomen W Wo Contrast  Result Date: 04/24/2021 CLINICAL DATA:  Evaluate renal mass EXAM: MRI ABDOMEN WITHOUT AND WITH CONTRAST TECHNIQUE: Multiplanar multisequence MR imaging of the abdomen was performed both before and after the administration of intravenous contrast. CONTRAST:  80mL GADAVIST GADOBUTROL 1 MMOL/ML IV SOLN COMPARISON:  CT chest dated April 19, 2021; CT abdomen and pelvis  dated July 31, 2018 FINDINGS: Lower chest: Nodule of the right lower lobe measuring up to 2.2 cm. Hepatobiliary: No mass or other parenchymal abnormality identified. Cholelithiasis no biliary ductal dilation. Pancreas: No mass, inflammatory changes, or other parenchymal abnormality identified. Spleen:  Within normal limits in size and appearance. Adrenals/Urinary Tract: Cystic lesion off the upper pole of the left kidney measuring 2.4 by 2.1 cm on series 9, image 13 with nodular enhancing septate. Areas of T1 hyperintensity are seen  within the lesion, likely due to hemorrhage. Simple cyst the lower pole of the right kidney. Stomach/Bowel: Visualized portions within the abdomen are unremarkable. Vascular/Lymphatic: No pathologically enlarged lymph nodes identified. Infrarenal abdominal aortic aneurysm measures up to 3.1 cm. Other:  None. Musculoskeletal: Posterior fusion hardware of the lumbosacral spine. T1 hyperintense lesion of the right iliac bone, unchanged compared to prior Dec 09, 2018 lumbar spine MRI unlikely a graft harvest site. No suspicious bone lesions identified. IMPRESSION: Cystic lesion about the upper pole of the left kidney with nodular enhancing septa, concerning for RCC. Indeterminate nodule of the right lower lobe. Recommend attention on scheduled PET-CT. Abdominal aortic aneurysm measures up to 3.1 cm. Recommend follow-up ultrasound every 3 years. This recommendation follows ACR consensus guidelines: White Paper of the ACR Incidental Findings Committee II on Vascular Findings. J Am Coll Radiol 2013; 10:789-794. Cholelithiasis. Aortic Atherosclerosis (ICD10-I70.0). Electronically Signed   By: Yetta Glassman M.D.   On: 04/24/2021 11:45   NM PET Image Initial (PI) Skull Base To Thigh  Result Date: 04/29/2021 CLINICAL DATA:  Initial treatment strategy for lung nodule. EXAM: NUCLEAR MEDICINE PET SKULL BASE TO THIGH TECHNIQUE: 9.0 mCi F-18 FDG was injected intravenously. Full-ring PET imaging was performed from the skull base to thigh after the radiotracer. CT data was obtained and used for attenuation correction and anatomic localization. Fasting blood glucose: 100 mg/dl COMPARISON:  MRI 04/23/2021, CT 04/19/2021 FINDINGS: Mediastinal blood pool activity: SUV max 1.8 Liver activity: SUV max NA NECK: No hypermetabolic lymph nodes in the neck. Incidental CT findings: none CHEST: Large ovoid smoothly marginated nodule in the RIGHT lower lobe measures 2.6 cm (image 136) and has no significant metabolic activity (SUV max equal  1.4). This is less activity than blood pool activity. No additional pulmonary nodules. No hypermetabolic or enlarged mediastinal lymph nodes. Coronary artery calcification and aortic atherosclerotic calcification. Incidental CT findings: none ABDOMEN/PELVIS: The lesion of concern in the LEFT renal cortex also has no associated metabolic activity (image 409 of the fused data set). No hypermetabolic lymph nodes in the abdomen pelvis. No abnormal activity in the liver. Adrenal glands are normal. Incidental CT findings: none SKELETON: No focal hypermetabolic activity to suggest skeletal metastasis. Incidental CT findings: Posterior lumbar fusion. IMPRESSION: 1. The RIGHT lower lobe pulmonary nodule has no significant metabolic activity. This would suggest benign etiology; however, the LEFT cystic renal lesion of concern also has no metabolic activity and therefore if pulmonary nodule is of renal origin a non metabolic active malignancy is a consideration. 2. No metabolic activity associated with the LEFT cystic renal cortical lesion of concern. Electronically Signed   By: Suzy Bouchard M.D.   On: 04/29/2021 10:43    ASSESSMENT: Left kidney mass  PLAN:    Left kidney mass: MRI of the abdomen on April 24, 2021 reviewed independently and reported as above with highly suspicious of left kidney lesion measuring 2.4 cm.  Lesion was negative on PET scan.  Case discussed with radiology and urology and recommendation is active  surveillance.  Patient expressed understanding that he may have to have surgical intervention at some point, but this is not necessary currently.  Return to clinic in 6 months with repeat MRI to assess for interval change. Right lower lobe pulmonary nodule: PET scan results from April 29, 2021 reviewed independently and reported as above with no significant activity in lesion.  This is likely benign.  We will repeat CT of the chest in 6 months along with MRI as above to ensure stability  or resolution of nodule. Weight loss: Patient was given a referral to dietary.  I spent a total of 60 minutes reviewing chart data, face-to-face evaluation with the patient, counseling and coordination of care as detailed above.   Patient expressed understanding and was in agreement with this plan. He also understands that He can call clinic at any time with any questions, concerns, or complaints.   Cancer Staging No matching staging information was found for the patient.  Lloyd Huger, MD   05/05/2021 6:36 PM

## 2021-05-11 ENCOUNTER — Inpatient Hospital Stay: Payer: Medicare Other | Attending: Oncology

## 2021-05-11 NOTE — Progress Notes (Signed)
Nutrition Assessment:  Referral for weight loss from Dr Grayland Ormond  70 year old male with lung mass and left kidney mass.  Planning surveillance at this time.  Past medical history of HTN, prostate cancer.    Spoke with patient and wife via phone.  Patient reports that he has not been trying to loose weight.  Says that he is wanting to gain weight.  Reports that over the last 2 months every time he eats he has to go to the bathroom (bowel movement, sometimes loose).  Says that a few days ago got nauseated and spit up clear phelgm but usually does not have issues with nausea.  Reports that he ate sausage, egg and cheese and toast that am and had to use the bathroom after.  Lunch is usually sandwich and water or Mt Dew and supper is usually meat with couple side items.      Medications: Vit D  Labs: none  Anthropometrics:   Height: 72 inches Weight: 165 lb on 9/29 170 lb 7/13 168 lb on 08/17/20 175 lb on 06/10/2019  BMI: 22  6% weight loss in the last 2 years, not significant   NUTRITION DIAGNOSIS: Unintentional weight loss related to ?? Altered GI function with frequent bowel movements after eating as evidenced by weight loss and patient concern   INTERVENTION:  Encouraged patient to keep log of what he is eating and frequency of bowel movements after eating and discuss with PCP.  Frequent bowel movements could be effecting ability to maintain weight. Patient says he sees PCP in November. Discussed trying to increase calories in diet,volume of food, adding snacks between meals.  Wife/patient concerned that he can't do that due to frequent bowel movements.  Also adding fatty foods will often increase bowel movements.  Patient had tried ensure/boost shakes and reports increase in bowel movements Contact information provided to patient.    NEXT VISIT: no follow-up RD available as needed  Nathan James, [place], [place] Registered Dietitian [phone] (mobile)

## 2021-08-10 ENCOUNTER — Other Ambulatory Visit: Payer: Self-pay

## 2021-08-11 ENCOUNTER — Encounter: Payer: Self-pay | Admitting: Urology

## 2021-08-16 ENCOUNTER — Ambulatory Visit: Payer: Self-pay | Admitting: Urology

## 2021-08-17 ENCOUNTER — Encounter: Payer: Self-pay | Admitting: Urology

## 2021-10-26 DIAGNOSIS — Z720 Tobacco use: Secondary | ICD-10-CM | POA: Diagnosis not present

## 2021-10-26 DIAGNOSIS — R911 Solitary pulmonary nodule: Secondary | ICD-10-CM | POA: Diagnosis not present

## 2021-11-01 ENCOUNTER — Ambulatory Visit
Admission: RE | Admit: 2021-11-01 | Discharge: 2021-11-01 | Disposition: A | Discharge status: Home / Self Care | Payer: Medicare HMO | Source: Ambulatory Visit | Attending: Oncology | Admitting: Oncology

## 2021-11-01 ENCOUNTER — Other Ambulatory Visit: Payer: Self-pay

## 2021-11-01 DIAGNOSIS — R918 Other nonspecific abnormal finding of lung field: Secondary | ICD-10-CM

## 2021-11-01 DIAGNOSIS — J984 Other disorders of lung: Secondary | ICD-10-CM | POA: Diagnosis not present

## 2021-11-01 DIAGNOSIS — R911 Solitary pulmonary nodule: Secondary | ICD-10-CM | POA: Diagnosis not present

## 2021-11-01 DIAGNOSIS — J439 Emphysema, unspecified: Secondary | ICD-10-CM | POA: Diagnosis not present

## 2021-11-01 DIAGNOSIS — K8020 Calculus of gallbladder without cholecystitis without obstruction: Secondary | ICD-10-CM | POA: Diagnosis not present

## 2021-11-01 DIAGNOSIS — N2889 Other specified disorders of kidney and ureter: Secondary | ICD-10-CM | POA: Insufficient documentation

## 2021-11-01 LAB — POCT I-STAT CREATININE: Creatinine, Ser: 1.1 mg/dL (ref 0.61–1.24)

## 2021-11-01 MED ORDER — GADOBUTROL 1 MMOL/ML IV SOLN
7.5000 mL | Freq: Once | INTRAVENOUS | Status: AC | PRN
  Administered 2021-11-01: 7.5 mL via INTRAVENOUS

## 2021-11-01 MED ORDER — IOHEXOL 300 MG/ML  SOLN
75.0000 mL | Freq: Once | INTRAMUSCULAR | Status: AC | PRN
  Administered 2021-11-01: 75 mL via INTRAVENOUS

## 2021-11-05 NOTE — Progress Notes (Signed)
?[place]  ?Telephone:(336) [phone] [phone] ? ?ID: Nathan James OB: 1951/04/03  MR#: [id number]  ERD#:[id number] ? ?Patient Care Team: ?Rusty Aus, MD as PCP - General (Internal Medicine) ?Telford Nab, RN as Sales executive ? ?CHIEF COMPLAINT: Left kidney mass ? ?INTERVAL HISTORY: Patient returns to clinic today for further evaluation and discussion of his imaging results.  He continues to complain of weight loss, but otherwise feels well.  He has no neurologic complaints.  He denies any recent fevers or illnesses.  He has no chest pain, shortness of breath, cough, or hemoptysis.  He denies any nausea, vomiting, constipation, or diarrhea.  He has no urinary complaints.  Patient offers no further specific complaints today. ? ?REVIEW OF SYSTEMS:   ?Review of Systems  ?Constitutional:  Positive for weight loss. Negative for fever and malaise/fatigue.  ?Respiratory: Negative.  Negative for cough, hemoptysis and shortness of breath.   ?Cardiovascular: Negative.  Negative for chest pain and leg swelling.  ?Gastrointestinal: Negative.  Negative for abdominal pain.  ?Genitourinary: Negative.  Negative for dysuria.  ?Musculoskeletal: Negative.  Negative for back pain.  ?Skin: Negative.  Negative for rash.  ?Neurological: Negative.  Negative for dizziness, focal weakness, weakness and headaches.  ?Psychiatric/Behavioral: Negative.  The patient is not nervous/anxious.   ? ?As per HPI. Otherwise, a complete review of systems is negative. ? ?PAST MEDICAL HISTORY: ?Past Medical History:  ?Diagnosis Date  ? Hypertension   ? Prostate cancer ([place]) 2020  ? Rad tx's.   ? ? ?PAST SURGICAL HISTORY: ?Past Surgical History:  ?Procedure Laterality Date  ? BACK SURGERY    ? ? ?FAMILY HISTORY: ?No family history on file. ? ?ADVANCED DIRECTIVES (Y/N):  N ? ?HEALTH MAINTENANCE: ?Social History  ? ?Tobacco Use  ? Smoking status: Every Day  ?  Packs/day: 0.75  ?  Types: Cigarettes  ? Smokeless  tobacco: Never  ?Substance Use Topics  ? Alcohol use: Yes  ? Drug use: Never  ? ? ? Colonoscopy: ? PAP: ? Bone density: ? Lipid panel: ? ?Allergies  ?Allergen Reactions  ? Atenolol Other (See Comments)  ?  Questionable.  ? ? ?Current Outpatient Medications  ?Medication Sig Dispense Refill  ? albuterol (VENTOLIN HFA) 108 (90 Base) MCG/ACT inhaler Inhale 2 puffs into the lungs every 6 (six) hours as needed for wheezing or shortness of breath. 1 g 0  ? amLODipine (NORVASC) 10 MG tablet Take 10 mg by mouth daily.  3  ? Cholecalciferol 50 MCG (2000 UT) TABS Take by mouth.    ? ferrous gluconate (FERGON) 324 MG tablet Take by mouth.    ? metoprolol succinate (TOPROL-XL) 100 MG 24 hr tablet TAKE 1 TABLET BY MOUTH EVERY DAY    ? tadalafil (CIALIS) 20 MG tablet Take 1 tablet (20 mg total) by mouth daily as needed for erectile dysfunction. 15 tablet 11  ? tamsulosin (FLOMAX) 0.4 MG CAPS capsule Take 1 capsule (0.4 mg total) by mouth daily after supper. 30 capsule 12  ? gabapentin (NEURONTIN) 300 MG capsule Take by mouth.    ? Multiple Vitamin (MULTI-VITAMIN) tablet Take 1 tablet by mouth daily.    ? omeprazole (PRILOSEC) 40 MG capsule Take 40 mg by mouth daily.    ? oxyCODONE-acetaminophen (PERCOCET/ROXICET) 5-325 MG tablet Take 1 tablet by mouth 3 (three) times daily.    ? potassium chloride (KLOR-CON) 10 MEQ tablet Take 20 mEq by mouth daily.    ? valACYclovir (VALTREX) 1000 MG tablet Take  1,000 mg by mouth 3 (three) times daily.    ? ?No current facility-administered medications for this visit.  ? ? ?OBJECTIVE: ?Vitals:  ? 11/07/21 1006  ?BP: 134/88  ?Pulse: 80  ?Resp: 16  ?Temp: 97.8 ?F (36.6 ?C)  ?SpO2: 99%  ?   Body mass index is 20.9 kg/m?Marland Kitchen    ECOG FS:1 - Symptomatic but completely ambulatory ? ?General: Well-developed, well-nourished, no acute distress. ?Eyes: Pink conjunctiva, anicteric sclera. ?HEENT: Normocephalic, moist mucous membranes. ?Lungs: No audible wheezing or coughing. ?Heart: Regular rate and  rhythm. ?Abdomen: Soft, nontender, no obvious distention. ?Musculoskeletal: No edema, cyanosis, or clubbing. ?Neuro: Alert, answering all questions appropriately. Cranial nerves grossly intact. ?Skin: No rashes or petechiae noted. ?Psych: Normal affect. ? ?LAB RESULTS: ? ?Lab Results  ?Component Value Date  ? NA 141 06/10/2019  ? K 4.7 06/10/2019  ? CL 103 06/10/2019  ? CO2 29 06/10/2019  ? GLUCOSE 105 (H) 06/10/2019  ? BUN 19 06/10/2019  ? CREATININE 1.10 11/01/2021  ? CALCIUM 9.6 06/10/2019  ? GFRNONAA >60 06/10/2019  ? GFRAA >60 06/10/2019  ? ? ?Lab Results  ?Component Value Date  ? WBC 4.9 06/10/2019  ? HGB 14.1 06/10/2019  ? HCT 43.2 06/10/2019  ? MCV 89.4 06/10/2019  ? PLT 143 (L) 06/10/2019  ? ? ? ?STUDIES: ?CT CHEST W CONTRAST ? ?Result Date: 11/01/2021 ?CLINICAL DATA:  Followup pulmonary nodule. EXAM: CT CHEST WITH CONTRAST TECHNIQUE: Multidetector CT imaging of the chest was performed during intravenous contrast administration. RADIATION DOSE REDUCTION: This exam was performed according to the departmental dose-optimization program which includes automated exposure control, adjustment of the mA and/or kV according to patient size and/or use of iterative reconstruction technique. CONTRAST:  69m OMNIPAQUE IOHEXOL 300 MG/ML  SOLN COMPARISON:  04/19/2021 FINDINGS: Cardiovascular: The heart is normal in size. No pericardial effusion. Stable tortuosity, ectasia and calcification of the thoracic aorta but no focal aneurysm or dissection. Stable three-vessel coronary artery calcifications. Mediastinum/Nodes: No mediastinal or hilar mass or lymphadenopathy. The esophagus is grossly normal. Lungs/Pleura: Stable emphysematous changes and pulmonary scarring. No acute overlying pulmonary process. Linear scarring changes are noted in both lower lobes. On the prior study there was a low-attenuation lesion associated with the lower aspect of the major fissure. This has near completely resolved. His only minimal residual  perifissural thickening. This is likely resolved fluid in the fissure. No new pulmonary lesions or pulmonary nodules. No pleural effusions or pleural nodules. Upper Abdomen: No significant upper abdominal findings. Stable vascular calcifications. Musculoskeletal: No chest wall mass, supraclavicular or axillary adenopathy. The bony thorax is intact. IMPRESSION: 1. Near complete resolution of the 2.7 cm low-attenuation lesion associated with the lower aspect of the major fissure. Minimal residual perifissural thickening. No follow-up imaging is necessary. 2. Stable emphysematous changes and pulmonary scarring. 3. No mediastinal or hilar mass or adenopathy. 4. Stable three-vessel coronary artery calcifications. Aortic Atherosclerosis (ICD10-I70.0) and Emphysema (ICD10-J43.9). Electronically Signed   By: PMarijo SanesM.D.   On: 11/01/2021 21:04  ? ?MR ABDOMEN W WO CONTRAST ? ?Result Date: 11/02/2021 ?CLINICAL DATA:  Renal lesions surveillance EXAM: MRI ABDOMEN WITHOUT AND WITH CONTRAST TECHNIQUE: Multiplanar multisequence MR imaging of the abdomen was performed both before and after the administration of intravenous contrast. CONTRAST:  7.527mGADAVIST GADOBUTROL 1 MMOL/ML IV SOLN COMPARISON:  Multiple exams, including 04/23/2021 FINDINGS: Lower chest: Unremarkable Hepatobiliary: Image 20 series 3 depicts multiple gallstones in the gallbladder measuring up to 1.4 cm diameter. Posteriorly in the right hepatic lobe,  a 0.7 by 0.5 cm T2 hyperintense lesion demonstrates precontrast T1 hypointensity on arterial phase images, isointensity on portal venous phase images, and delayed enhancement on delayed images such as image 20 of series 20, favoring a small hemangioma. No change from prior. Pancreas:  Unremarkable Spleen:  Unremarkable Adrenals/Urinary Tract: 2.5 by 1.9 cm Bosniak category 1 cyst of the right kidney lower pole on image 23 series 4. Left kidney upper pole T1 hyperintense lesion measures 1.8 by 1.1 cm on image 26  series 12. Currently using manual measurements of signal intensity I do not discern measurable enhancement within this lesion, and the subtraction images less affected by misregistration (such as the delayed images

## 2021-11-07 ENCOUNTER — Inpatient Hospital Stay: Payer: Medicare HMO | Attending: Oncology | Admitting: Oncology

## 2021-11-07 VITALS — BP 134/88 | HR 80 | Temp 97.8°F | Resp 16 | Ht 72.0 in | Wt 154.1 lb

## 2021-11-07 DIAGNOSIS — R634 Abnormal weight loss: Secondary | ICD-10-CM | POA: Diagnosis not present

## 2021-11-07 DIAGNOSIS — N2889 Other specified disorders of kidney and ureter: Secondary | ICD-10-CM | POA: Insufficient documentation

## 2021-11-07 DIAGNOSIS — F17210 Nicotine dependence, cigarettes, uncomplicated: Secondary | ICD-10-CM | POA: Diagnosis not present

## 2021-11-23 ENCOUNTER — Inpatient Hospital Stay: Payer: Medicare HMO

## 2021-11-23 ENCOUNTER — Other Ambulatory Visit: Payer: Self-pay

## 2021-11-23 DIAGNOSIS — R197 Diarrhea, unspecified: Secondary | ICD-10-CM

## 2021-11-23 DIAGNOSIS — R634 Abnormal weight loss: Secondary | ICD-10-CM

## 2021-11-23 NOTE — Progress Notes (Addendum)
Nutrition Follow-up: ? ?RD last met with patient via phone on 05/2021.   ? ?Patient with left kidney mass currently under surveillance.  Noted per MD note right lower lobe pulmonary nodule completely resolved.  Past medical history of HTN, prostate cancer, smoker ? ?Met with patient and wife in clinic.  Patient reports that appetite is about 50%.  Had 2 eggs and pork chop this am for breakfast with bread, water and coffee.  Lunch is around 12-1pm and may have a hamburger or chicken breast (fried) with water or sprite.  Supper last night was cheese steak burrito and ate 1/2 of it.  Patient reports that diarrhea is better after starting (?? Omeprazole in Jan).  Noted started this by PCP for gastritis.  Patient reports previously having diarrhea after every meal.  Today says that he has not had any diarrhea this week but had some last week.  Patient limiting foods in his diet due to past issues with diarrhea or stomach upset. Patient not able to tolerate tossed salad, potato but able to eat french fries, some ice cream but not too much.   ? ?Orbital Region: normal ?Buccal Region: normal ?Upper Arm Region: mild ?Thoracic and Lumbar Region: normal ?Temple Region: mild ?Clavicle Bone Region: normal ?Shoulder and Acromion Bone Region: unable to assess ?Scapular Bone Region: unable to assess ?Dorsal Hand: normal ?Patellar Region: mild ?Anterior Thigh Region: mild ?Posterior Calf Region: normal ?Edema (RD assessment): none ?Hair: none present ?Eyes: reviewed ?Mouth: reviewed ?Skin: reviewed ?Nails: reviewed ? ? ? ?Medications: Vit D, fergon, prilosec, Vit B12, Vit C ? ?Labs: reviewed ? ?Anthropometrics:  ? ?Weight today 158 lb ? ?154 lb 1/6 oz on 4/4 ?165 lb on 05/04/21 ?170 lb 02/15/22 ? ?4% weight loss in the last 7 months, not significant but concerning ? ?NUTRITION DIAGNOSIS: Inadequate oral intake related to altered GI function as evidenced by 4% weight loss in the last 7 months, fear of eating due to past issues with  diarrhea.   ? ? ?INTERVENTION:  ?Discussed with Dr Finnegan and MD recommended referral to GI for evaluation of diarrhea. ?Discussed with patient adding snack between meals of "safe" foods that do not cause diarrhea (ie peanut butter nabs, 1/2 meat sandwich, honeybun) ?Samples of orgain and [employer]  1.4 shake given to patient to try and ensure clear along with coupons.  Regular ensure/boost cause diarrhea. ?Discussed trying lactaid products  ?Recommend daily MVI ?Contact information given ?  ? ?MONITORING, EVALUATION, GOAL: weight trends, intake ? ? ?NEXT VISIT: weight trends, intake ? ?Joli B. Allen, RD, LDN ?Registered Dietitian ?[phone] ? ? ?

## 2021-12-14 DIAGNOSIS — F1190 Opioid use, unspecified, uncomplicated: Secondary | ICD-10-CM | POA: Diagnosis not present

## 2021-12-14 DIAGNOSIS — Z79899 Other long term (current) drug therapy: Secondary | ICD-10-CM | POA: Diagnosis not present

## 2021-12-14 DIAGNOSIS — C61 Malignant neoplasm of prostate: Secondary | ICD-10-CM | POA: Diagnosis not present

## 2021-12-21 DIAGNOSIS — Z0000 Encounter for general adult medical examination without abnormal findings: Secondary | ICD-10-CM | POA: Diagnosis not present

## 2021-12-21 DIAGNOSIS — C61 Malignant neoplasm of prostate: Secondary | ICD-10-CM | POA: Diagnosis not present

## 2021-12-21 DIAGNOSIS — Z1389 Encounter for screening for other disorder: Secondary | ICD-10-CM | POA: Diagnosis not present

## 2021-12-21 DIAGNOSIS — Z79899 Other long term (current) drug therapy: Secondary | ICD-10-CM | POA: Diagnosis not present

## 2021-12-21 DIAGNOSIS — I712 Thoracic aortic aneurysm, without rupture, unspecified: Secondary | ICD-10-CM | POA: Diagnosis not present

## 2021-12-21 DIAGNOSIS — J449 Chronic obstructive pulmonary disease, unspecified: Secondary | ICD-10-CM | POA: Diagnosis not present

## 2021-12-21 DIAGNOSIS — M4800 Spinal stenosis, site unspecified: Secondary | ICD-10-CM | POA: Diagnosis not present

## 2021-12-21 DIAGNOSIS — Z125 Encounter for screening for malignant neoplasm of prostate: Secondary | ICD-10-CM | POA: Diagnosis not present

## 2022-01-04 ENCOUNTER — Inpatient Hospital Stay: Payer: Medicare HMO | Attending: Oncology

## 2022-01-04 NOTE — Progress Notes (Signed)
Nutrition  Patient did not show up for nutrition follow-up today.  Message sent to scheduling to offer another appointment.  Sheyann Sulton B. Zenia Resides, [place], [place] Registered Dietitian [phone]

## 2022-01-09 ENCOUNTER — Inpatient Hospital Stay: Payer: Medicare HMO

## 2022-01-09 NOTE — Progress Notes (Signed)
Nutrition Follow-up:  Patient with left kidney mass currently under surveillance.  Followed by Dr Grayland Ormond.  Met with patient in clinic for follow-up.  Patient has not been seen by GI.  Noted GI's office tried to call patient times 3 and sent him a letter to get him scheduled.  RD questioned patient about this and he said he thought it was RD.  Says that his appetite might be a little bit better than 50%.  Says that he is eating 3 meals per day.  For lunch today ate cheeseburger (100%), 1/2 of french fries and soda before coming.  Ate a hot dog and 2 eggs for breakfast and drank 1 1/2 bottles of water. Last night for supper ate 2 chicken legs, butter beans and drank water.  About 9pm ate 2 honeybuns with grandson and had diarrhea this am.  Says that if he eats anything sweet if sends him to the bathroom (cakes, cookies, honeybuns).  Has not tried lactaid products but says wife has almond milk at home.  He has not tried the almond milk.  Says that diarrhea is better after starting a pill that Dr Sabra Heck prescribed (omeprazole?? In Jan 2023).  Patient is a smoker.  Says that he drinks alcohol but will not quantify how much or how frequent.  Says that he like the ensure clear shake but was unable to drink the other ones.  Drinks it about 3 times per week.  Does not think it causes diarrhea.      Medications: reviewed  Labs: no new  Anthropometrics:   Weight today 155 lb  158 lb on 4/20 154 lb 1.6 oz on 4/4 165 lb on 05/04/21 170 lb on 02/15/21  8% weight loss in the last 11 months, concerning   NUTRITION DIAGNOSIS: Inadequate oral intake continues to be related to altered GI function   INTERVENTION:  Provided patient with phone number of GI office to call and make an appointment. Strongly encouraged patient to be evaluated by GI for diarrhea.   Continue ensure clear shakes if does not increase diarrhea.  Encouraged patient to try lactaid free products Could consider psyllium fiber or banana  flakes for diarrhea but would prefer for patient to be evaluated by GI for recommendations.  Contact information provided    NEXT VISIT: no follow-up Patient to contact RD  if has further questions or concerns  Massiah Longanecker B. Zenia Resides, [place], [place] Registered Dietitian [phone]

## 2022-01-11 DIAGNOSIS — H524 Presbyopia: Secondary | ICD-10-CM | POA: Diagnosis not present

## 2022-01-11 DIAGNOSIS — H25813 Combined forms of age-related cataract, bilateral: Secondary | ICD-10-CM | POA: Diagnosis not present

## 2022-02-15 ENCOUNTER — Ambulatory Visit: Payer: Medicare HMO | Admitting: Urology

## 2022-05-09 ENCOUNTER — Ambulatory Visit: Admission: RE | Admit: 2022-05-09 | Payer: Medicare HMO | Source: Ambulatory Visit

## 2022-05-11 NOTE — Progress Notes (Signed)
[facility]  [phone] [phone]  ID: ISRRAEL FLUCKIGER OB: [birth year]  MR#: [id number]  CSN#:[id number]  Patient Care Team: Rusty Aus, MD as PCP - General (Internal Medicine) Telford Nab, RN as Oncology Nurse Navigator  CHIEF COMPLAINT: Left kidney mass  INTERVAL HISTORY: Patient returns to clinic today for routine yearly evaluation and discussion of his MRI results.  He currently feels well and is asymptomatic.  He does not complain of any further weight loss, but states he is unable to gain weight back.  He has no neurologic complaints.  He denies any recent fevers or illnesses.  He has no chest pain, shortness of breath, cough, or hemoptysis.  He denies any nausea, vomiting, constipation, or diarrhea.  He has no urinary complaints.  Patient feels at his baseline and offers no specific complaints today.  REVIEW OF SYSTEMS:   Review of Systems  Constitutional: Negative.  Negative for fever, malaise/fatigue and weight loss.  Respiratory: Negative.  Negative for cough, hemoptysis and shortness of breath.   Cardiovascular: Negative.  Negative for chest pain and leg swelling.  Gastrointestinal: Negative.  Negative for abdominal pain.  Genitourinary: Negative.  Negative for dysuria.  Musculoskeletal: Negative.  Negative for back pain.  Skin: Negative.  Negative for rash.  Neurological: Negative.  Negative for dizziness, focal weakness, weakness and headaches.  Psychiatric/Behavioral: Negative.  The patient is not nervous/anxious.     As per HPI. Otherwise, a complete review of systems is negative.  PAST MEDICAL HISTORY: Past Medical History:  Diagnosis Date   Hypertension    Prostate cancer ([place]) 2020   Rad tx's.     PAST SURGICAL HISTORY: Past Surgical History:  Procedure Laterality Date   BACK SURGERY      FAMILY HISTORY: History reviewed. No pertinent family history.  ADVANCED DIRECTIVES (Y/N):  N  HEALTH  MAINTENANCE: Social History   Tobacco Use   Smoking status: Every Day    Packs/day: 0.75    Types: Cigarettes   Smokeless tobacco: Never  Substance Use Topics   Alcohol use: Yes   Drug use: Never     Colonoscopy:  PAP:  Bone density:  Lipid panel:  Allergies  Allergen Reactions   Atenolol Other (See Comments)    Questionable.    Current Outpatient Medications  Medication Sig Dispense Refill   albuterol (VENTOLIN HFA) 108 (90 Base) MCG/ACT inhaler Inhale 2 puffs into the lungs every 6 (six) hours as needed for wheezing or shortness of breath. 1 g 0   amLODipine (NORVASC) 10 MG tablet Take 10 mg by mouth daily.  3   Cholecalciferol 50 MCG (2000 UT) TABS Take by mouth.     ferrous gluconate (FERGON) 324 MG tablet Take by mouth.     gabapentin (NEURONTIN) 300 MG capsule Take by mouth.     metoprolol succinate (TOPROL-XL) 100 MG 24 hr tablet TAKE 1 TABLET BY MOUTH EVERY DAY     Multiple Vitamin (MULTI-VITAMIN) tablet Take 1 tablet by mouth daily.     omeprazole (PRILOSEC) 40 MG capsule Take 40 mg by mouth daily.     oxyCODONE-acetaminophen (PERCOCET/ROXICET) 5-325 MG tablet Take 1 tablet by mouth 3 (three) times daily.     potassium chloride (KLOR-CON) 10 MEQ tablet Take 20 mEq by mouth daily.     tadalafil (CIALIS) 20 MG tablet Take 1 tablet (20 mg total) by mouth daily as needed for erectile dysfunction. 15 tablet 11   tamsulosin (FLOMAX) 0.4 MG CAPS capsule Take  1 capsule (0.4 mg total) by mouth daily after supper. 30 capsule 12   valACYclovir (VALTREX) 1000 MG tablet Take 1,000 mg by mouth 3 (three) times daily.     No current facility-administered medications for this visit.    OBJECTIVE: Vitals:   05/15/22 1103  BP: (!) 147/87  Pulse: 63  Temp: (!) 96.3 F (35.7 C)     Body mass index is 20.75 kg/m.    ECOG FS:0 - Asymptomatic  General: Well-developed, well-nourished, no acute distress. Eyes: Pink conjunctiva, anicteric sclera. HEENT: Normocephalic, moist  mucous membranes. Lungs: No audible wheezing or coughing. Heart: Regular rate and rhythm. Abdomen: Soft, nontender, no obvious distention. Musculoskeletal: No edema, cyanosis, or clubbing. Neuro: Alert, answering all questions appropriately. Cranial nerves grossly intact. Skin: No rashes or petechiae noted. Psych: Normal affect.   LAB RESULTS:  Lab Results  Component Value Date   NA 141 06/10/2019   K 4.7 06/10/2019   CL 103 06/10/2019   CO2 29 06/10/2019   GLUCOSE 105 (H) 06/10/2019   BUN 19 06/10/2019   CREATININE 1.10 11/01/2021   CALCIUM 9.6 06/10/2019   GFRNONAA >60 06/10/2019   GFRAA >60 06/10/2019    Lab Results  Component Value Date   WBC 4.9 06/10/2019   HGB 14.1 06/10/2019   HCT 43.2 06/10/2019   MCV 89.4 06/10/2019   PLT 143 (L) 06/10/2019     STUDIES: No results found.  ASSESSMENT: Left kidney mass  PLAN:    Left kidney mass: Patient did not have repeat MRI prior to clinic visit and this has been ordered to be done in the next 1 to 2 weeks.  Previously, MRI on November 01, 2021 reviewed independently with improvement and decrease in size of left kidney lesion.  Although unlikely malignancy, active surveillance is warranted.  We will schedule MRI in the next 1 to 2 weeks.  If continued improvement, no further follow-up is needed. Right lower lobe pulmonary nodule: Completely resolved.  CT scan from November 01, 2021 reviewed independently with no evidence of residual nodule.  No further imaging is necessary.   Weight loss: Resolved.  Patient has previously seen dietary.  Patient expressed understanding and was in agreement with this plan. He also understands that He can call clinic at any time with any questions, concerns, or complaints.    Cancer Staging  No matching staging information was found for the patient.  Lloyd Huger, MD   05/16/2022 6:12 AM

## 2022-05-15 ENCOUNTER — Encounter: Payer: Self-pay | Admitting: Oncology

## 2022-05-15 ENCOUNTER — Inpatient Hospital Stay: Payer: Medicare HMO | Attending: Oncology | Admitting: Oncology

## 2022-05-15 VITALS — BP 147/87 | HR 63 | Temp 96.3°F | Ht 72.0 in | Wt 153.0 lb

## 2022-05-15 DIAGNOSIS — F17210 Nicotine dependence, cigarettes, uncomplicated: Secondary | ICD-10-CM | POA: Insufficient documentation

## 2022-05-15 DIAGNOSIS — N2889 Other specified disorders of kidney and ureter: Secondary | ICD-10-CM | POA: Diagnosis not present

## 2022-05-22 ENCOUNTER — Encounter: Payer: Self-pay | Admitting: Gastroenterology

## 2022-05-22 ENCOUNTER — Ambulatory Visit (INDEPENDENT_AMBULATORY_CARE_PROVIDER_SITE_OTHER): Payer: Medicare HMO | Admitting: Gastroenterology

## 2022-05-22 ENCOUNTER — Other Ambulatory Visit: Payer: Self-pay | Admitting: Gastroenterology

## 2022-05-22 VITALS — BP 148/88 | HR 78 | Temp 98.3°F | Ht 72.0 in | Wt 155.0 lb

## 2022-05-22 DIAGNOSIS — R197 Diarrhea, unspecified: Secondary | ICD-10-CM | POA: Diagnosis not present

## 2022-05-22 DIAGNOSIS — R634 Abnormal weight loss: Secondary | ICD-10-CM | POA: Diagnosis not present

## 2022-05-22 MED ORDER — NA SULFATE-K SULFATE-MG SULF 17.5-3.13-1.6 GM/177ML PO SOLN
1.0000 | Freq: Once | ORAL | 0 refills | Status: AC
Start: 2022-05-22 — End: 2022-05-22

## 2022-05-22 MED ORDER — CLENPIQ 10-3.5-12 MG-GM -GM/160ML PO SOLN: 1.0000 | Freq: Once | ORAL | 0 refills | Status: AC

## 2022-05-22 NOTE — Progress Notes (Signed)
Gastroenterology Consultation  Referring Provider:     Rusty Aus, MD Primary Care Physician:  Rusty Aus, MD Primary Gastroenterologist:  Dr. Allen Norris     Reason for Consultation:     Loss of weight and diarrhea        HPI:   Nathan James is a 71 y.o. y/o male referred for consultation & management of loss of weight and diarrhea by Dr. Sabra Heck, Christean Grief, MD. This patient comes in today to see me after having a colonoscopy in 2021 by Dr. Alice Reichert.  That procedure was done off-site and the report is not readily accessible.  The patient had a normal CMP with a CBC showing a hemoglobin of 13.7 with normal of 14.1 and a normal MCV.  He says that he was 180 lbs before his prostate cancer 3 years ago and now he is 155 lbs today. He reports greens and salads cause him diarrhea. He has diarrhea at least twice a week. The patient denies any black stools or bloody stools.  He also denies any family history of colon cancer or colon polyps.  He denies any abdominal pain at the present time.  He is concerned that he is not able to gain the weight back after being at 180 pounds most of his life.  There is no report of any early satiety.  The diarrhea happens a few times a week and he denies any dairy intake.  He does report that greasy foods can also make him have diarrhea.   Past Medical History:  Diagnosis Date   Hypertension    Prostate cancer ([place]) 2020   Rad tx's.     Past Surgical History:  Procedure Laterality Date   BACK SURGERY      Prior to Admission medications   Medication Sig Start Date End Date Taking? Authorizing Provider  albuterol (VENTOLIN HFA) 108 (90 Base) MCG/ACT inhaler Inhale 2 puffs into the lungs every 6 (six) hours as needed for wheezing or shortness of breath. 06/11/19   Gregor Hams, MD  amLODipine (NORVASC) 10 MG tablet Take 10 mg by mouth daily. 04/14/18   [provider]  Cholecalciferol 50 MCG (2000 UT) TABS Take by mouth.    [provider]   ferrous gluconate (FERGON) 324 MG tablet Take by mouth. 10/26/21 10/26/22  [provider]  gabapentin (NEURONTIN) 300 MG capsule Take by mouth. 09/19/21   [provider]  metoprolol succinate (TOPROL-XL) 100 MG 24 hr tablet TAKE 1 TABLET BY MOUTH EVERY DAY 02/25/15   [provider]  Multiple Vitamin (MULTI-VITAMIN) tablet Take 1 tablet by mouth daily.    [provider]  omeprazole (PRILOSEC) 40 MG capsule Take 40 mg by mouth daily. 08/11/21   [provider]  oxyCODONE-acetaminophen (PERCOCET/ROXICET) 5-325 MG tablet Take 1 tablet by mouth 3 (three) times daily. 07/24/21   [provider]  potassium chloride (KLOR-CON) 10 MEQ tablet Take 20 mEq by mouth daily. 08/09/21   [provider]  tadalafil (CIALIS) 20 MG tablet Take 1 tablet (20 mg total) by mouth daily as needed for erectile dysfunction. 08/17/20   Billey Co, MD  tamsulosin (FLOMAX) 0.4 MG CAPS capsule Take 1 capsule (0.4 mg total) by mouth daily after supper. 12/16/18   Noreene Filbert, MD  valACYclovir (VALTREX) 1000 MG tablet Take 1,000 mg by mouth 3 (three) times daily. 08/11/21   [provider]    No family history on file.   Social  History   Tobacco Use   Smoking status: Every Day    Packs/day: 0.75    Types: Cigarettes   Smokeless tobacco: Never  Substance Use Topics   Alcohol use: Yes   Drug use: Never    Allergies as of 05/22/2022 - Review Complete 05/15/2022  Allergen Reaction Noted   Atenolol Other (See Comments) 02/03/2014    Review of Systems:    All systems reviewed and negative except where noted in HPI.   Physical Exam:  There were no vitals taken for this visit. No LMP for male patient. General:   Alert,  Well-developed, well-nourished, pleasant and cooperative in NAD Head:  Normocephalic and atraumatic. Eyes:  Sclera clear, no icterus.   Conjunctiva pink. Ears:  Normal auditory acuity. Neck:  Supple; no masses or  thyromegaly. Lungs:  Respirations even and unlabored.  Clear throughout to auscultation.   No wheezes, crackles, or rhonchi. No acute distress. Heart:  Regular rate and rhythm; no murmurs, clicks, rubs, or gallops. Abdomen:  Normal bowel sounds.  No bruits.  Soft, non-tender and non-distended without masses, hepatosplenomegaly or hernias noted.  No guarding or rebound tenderness.  Negative Carnett sign.   Rectal:  Deferred.  Pulses:  Normal pulses noted. Extremities:  No clubbing or edema.  No cyanosis. Neurologic:  Alert and oriented x3;  grossly normal neurologically. Skin:  Intact without significant lesions or rashes.  No jaundice. Lymph Nodes:  No significant cervical adenopathy. Psych:  Alert and cooperative. Normal mood and affect.  Imaging Studies: No results found.  Assessment and Plan:   Nathan James is a 71 y.o. y/o male who comes in today with unexplained weight loss with diarrhea that started after the patient had his prostate cancer.  The patient has had inability to gain any weight.  The patient will be set up for an EGD and colonoscopy to look for source of the weight loss and diarrhea and due to his mild anemia.  The patient has been explained the plan and agrees with it.    Lucilla Lame, MD. Mar[facility] Regal    Note: This dictation was prepared with Dragon dictation along with smaller phrase technology. Any transcriptional errors that result from this process are unintentional.

## 2022-05-23 ENCOUNTER — Ambulatory Visit
Admission: RE | Admit: 2022-05-23 | Discharge: 2022-05-23 | Disposition: A | Discharge status: Home / Self Care | Payer: Medicare HMO | Source: Ambulatory Visit | Attending: Oncology | Admitting: Oncology

## 2022-05-23 DIAGNOSIS — C801 Malignant (primary) neoplasm, unspecified: Secondary | ICD-10-CM | POA: Diagnosis not present

## 2022-05-23 DIAGNOSIS — N281 Cyst of kidney, acquired: Secondary | ICD-10-CM | POA: Diagnosis not present

## 2022-05-23 DIAGNOSIS — N2889 Other specified disorders of kidney and ureter: Secondary | ICD-10-CM | POA: Diagnosis not present

## 2022-05-23 DIAGNOSIS — K8020 Calculus of gallbladder without cholecystitis without obstruction: Secondary | ICD-10-CM | POA: Diagnosis not present

## 2022-05-23 MED ORDER — GADOBUTROL 1 MMOL/ML IV SOLN
7.0000 mL | Freq: Once | INTRAVENOUS | Status: AC | PRN
  Administered 2022-05-23: 7 mL via INTRAVENOUS

## 2022-05-29 ENCOUNTER — Ambulatory Visit: Payer: Medicare HMO | Admitting: Gastroenterology

## 2022-06-19 ENCOUNTER — Telehealth: Payer: Self-pay | Admitting: Gastroenterology

## 2022-06-19 NOTE — Telephone Encounter (Signed)
Per pt he wants to r/s colonoscopy. His number is [phone] home cell  [phone].

## 2022-06-20 NOTE — Telephone Encounter (Signed)
Left message on voicemail.

## 2022-06-21 ENCOUNTER — Encounter: Admission: RE | Payer: Self-pay | Source: Home / Self Care

## 2022-06-21 ENCOUNTER — Ambulatory Visit: Admission: RE | Admit: 2022-06-21 | Payer: Medicare HMO | Source: Home / Self Care | Admitting: Gastroenterology

## 2022-06-21 SURGERY — COLONOSCOPY WITH PROPOFOL
Anesthesia: General

## 2022-06-21 NOTE — Telephone Encounter (Signed)
Left message on voicemail.

## 2022-07-04 DIAGNOSIS — Z125 Encounter for screening for malignant neoplasm of prostate: Secondary | ICD-10-CM | POA: Diagnosis not present

## 2022-07-04 DIAGNOSIS — Z79899 Other long term (current) drug therapy: Secondary | ICD-10-CM | POA: Diagnosis not present

## 2022-07-04 DIAGNOSIS — E538 Deficiency of other specified B group vitamins: Secondary | ICD-10-CM | POA: Diagnosis not present

## 2022-07-11 DIAGNOSIS — Z0000 Encounter for general adult medical examination without abnormal findings: Secondary | ICD-10-CM | POA: Diagnosis not present

## 2022-07-11 DIAGNOSIS — F1190 Opioid use, unspecified, uncomplicated: Secondary | ICD-10-CM | POA: Diagnosis not present

## 2022-07-11 DIAGNOSIS — Z79899 Other long term (current) drug therapy: Secondary | ICD-10-CM | POA: Diagnosis not present

## 2022-07-11 DIAGNOSIS — J449 Chronic obstructive pulmonary disease, unspecified: Secondary | ICD-10-CM | POA: Diagnosis not present

## 2022-07-11 DIAGNOSIS — C61 Malignant neoplasm of prostate: Secondary | ICD-10-CM | POA: Diagnosis not present

## 2022-07-11 DIAGNOSIS — N2889 Other specified disorders of kidney and ureter: Secondary | ICD-10-CM | POA: Diagnosis not present

## 2022-08-13 DIAGNOSIS — J209 Acute bronchitis, unspecified: Secondary | ICD-10-CM | POA: Diagnosis not present

## 2022-10-03 DIAGNOSIS — Z0100 Encounter for examination of eyes and vision without abnormal findings: Secondary | ICD-10-CM | POA: Diagnosis not present

## 2023-01-03 DIAGNOSIS — Z79899 Other long term (current) drug therapy: Secondary | ICD-10-CM | POA: Diagnosis not present

## 2023-01-03 DIAGNOSIS — C61 Malignant neoplasm of prostate: Secondary | ICD-10-CM | POA: Diagnosis not present

## 2023-01-03 DIAGNOSIS — F1190 Opioid use, unspecified, uncomplicated: Secondary | ICD-10-CM | POA: Diagnosis not present

## 2023-01-10 DIAGNOSIS — D696 Thrombocytopenia, unspecified: Secondary | ICD-10-CM | POA: Diagnosis not present

## 2023-01-10 DIAGNOSIS — Z0000 Encounter for general adult medical examination without abnormal findings: Secondary | ICD-10-CM | POA: Diagnosis not present

## 2023-01-10 DIAGNOSIS — Z1331 Encounter for screening for depression: Secondary | ICD-10-CM | POA: Diagnosis not present

## 2023-01-10 DIAGNOSIS — Z79899 Other long term (current) drug therapy: Secondary | ICD-10-CM | POA: Diagnosis not present

## 2023-02-06 DIAGNOSIS — R739 Hyperglycemia, unspecified: Secondary | ICD-10-CM | POA: Diagnosis not present

## 2023-02-06 DIAGNOSIS — J432 Centrilobular emphysema: Secondary | ICD-10-CM | POA: Diagnosis not present

## 2023-02-06 DIAGNOSIS — Z79899 Other long term (current) drug therapy: Secondary | ICD-10-CM | POA: Diagnosis not present

## 2023-02-06 DIAGNOSIS — D696 Thrombocytopenia, unspecified: Secondary | ICD-10-CM | POA: Diagnosis not present

## 2023-07-10 DIAGNOSIS — Z79899 Other long term (current) drug therapy: Secondary | ICD-10-CM | POA: Diagnosis not present

## 2023-07-10 DIAGNOSIS — Z125 Encounter for screening for malignant neoplasm of prostate: Secondary | ICD-10-CM | POA: Diagnosis not present

## 2023-07-10 DIAGNOSIS — R739 Hyperglycemia, unspecified: Secondary | ICD-10-CM | POA: Diagnosis not present

## 2023-07-17 DIAGNOSIS — N2889 Other specified disorders of kidney and ureter: Secondary | ICD-10-CM | POA: Diagnosis not present

## 2023-07-17 DIAGNOSIS — D696 Thrombocytopenia, unspecified: Secondary | ICD-10-CM | POA: Diagnosis not present

## 2023-07-17 DIAGNOSIS — Z79899 Other long term (current) drug therapy: Secondary | ICD-10-CM | POA: Diagnosis not present

## 2023-07-17 DIAGNOSIS — Z0000 Encounter for general adult medical examination without abnormal findings: Secondary | ICD-10-CM | POA: Diagnosis not present

## 2023-07-17 DIAGNOSIS — M4800 Spinal stenosis, site unspecified: Secondary | ICD-10-CM | POA: Diagnosis not present

## 2023-07-17 DIAGNOSIS — J449 Chronic obstructive pulmonary disease, unspecified: Secondary | ICD-10-CM | POA: Diagnosis not present

## 2023-07-17 DIAGNOSIS — Z23 Encounter for immunization: Secondary | ICD-10-CM | POA: Diagnosis not present

## 2023-07-17 DIAGNOSIS — F1190 Opioid use, unspecified, uncomplicated: Secondary | ICD-10-CM | POA: Diagnosis not present

## 2023-07-17 DIAGNOSIS — I714 Abdominal aortic aneurysm, without rupture, unspecified: Secondary | ICD-10-CM | POA: Diagnosis not present

## 2023-07-19 ENCOUNTER — Other Ambulatory Visit: Payer: Self-pay | Admitting: Internal Medicine

## 2023-07-19 DIAGNOSIS — N2889 Other specified disorders of kidney and ureter: Secondary | ICD-10-CM

## 2023-07-29 ENCOUNTER — Ambulatory Visit
Admission: RE | Admit: 2023-07-29 | Discharge: 2023-07-29 | Disposition: A | Discharge status: Home / Self Care | Payer: Medicare HMO | Source: Ambulatory Visit | Attending: Internal Medicine | Admitting: Internal Medicine

## 2023-07-29 DIAGNOSIS — K8020 Calculus of gallbladder without cholecystitis without obstruction: Secondary | ICD-10-CM | POA: Diagnosis not present

## 2023-07-29 DIAGNOSIS — I7143 Infrarenal abdominal aortic aneurysm, without rupture: Secondary | ICD-10-CM | POA: Diagnosis not present

## 2023-07-29 DIAGNOSIS — N281 Cyst of kidney, acquired: Secondary | ICD-10-CM | POA: Diagnosis not present

## 2023-07-29 DIAGNOSIS — N2889 Other specified disorders of kidney and ureter: Secondary | ICD-10-CM | POA: Diagnosis not present

## 2023-07-29 DIAGNOSIS — I700 Atherosclerosis of aorta: Secondary | ICD-10-CM | POA: Diagnosis not present

## 2023-07-29 MED ORDER — GADOBUTROL 1 MMOL/ML IV SOLN
7.0000 mL | Freq: Once | INTRAVENOUS | Status: AC | PRN
  Administered 2023-07-29: 7 mL via INTRAVENOUS

## 2023-10-15 DIAGNOSIS — R042 Hemoptysis: Secondary | ICD-10-CM | POA: Diagnosis not present

## 2023-10-29 ENCOUNTER — Other Ambulatory Visit: Payer: Self-pay | Admitting: Physician Assistant

## 2023-10-29 DIAGNOSIS — J449 Chronic obstructive pulmonary disease, unspecified: Secondary | ICD-10-CM

## 2023-10-29 DIAGNOSIS — J90 Pleural effusion, not elsewhere classified: Secondary | ICD-10-CM

## 2023-10-29 DIAGNOSIS — R042 Hemoptysis: Secondary | ICD-10-CM

## 2023-11-01 ENCOUNTER — Ambulatory Visit
Admission: RE | Admit: 2023-11-01 | Discharge: 2023-11-01 | Disposition: A | Discharge status: Home / Self Care | Source: Ambulatory Visit | Attending: Physician Assistant | Admitting: Physician Assistant

## 2023-11-01 DIAGNOSIS — J90 Pleural effusion, not elsewhere classified: Secondary | ICD-10-CM | POA: Insufficient documentation

## 2023-11-01 DIAGNOSIS — J449 Chronic obstructive pulmonary disease, unspecified: Secondary | ICD-10-CM | POA: Diagnosis not present

## 2023-11-01 DIAGNOSIS — R042 Hemoptysis: Secondary | ICD-10-CM | POA: Insufficient documentation

## 2023-11-01 DIAGNOSIS — R918 Other nonspecific abnormal finding of lung field: Secondary | ICD-10-CM | POA: Diagnosis not present

## 2023-11-01 DIAGNOSIS — I77810 Thoracic aortic ectasia: Secondary | ICD-10-CM | POA: Diagnosis not present

## 2023-11-01 DIAGNOSIS — R0602 Shortness of breath: Secondary | ICD-10-CM | POA: Diagnosis not present

## 2023-11-01 MED ORDER — IOHEXOL 300 MG/ML  SOLN
75.0000 mL | Freq: Once | INTRAMUSCULAR | Status: AC | PRN
  Administered 2023-11-01: 75 mL via INTRAVENOUS

## 2023-11-12 DIAGNOSIS — I10 Essential (primary) hypertension: Secondary | ICD-10-CM | POA: Diagnosis not present

## 2024-01-08 DIAGNOSIS — C61 Malignant neoplasm of prostate: Secondary | ICD-10-CM | POA: Diagnosis not present

## 2024-01-08 DIAGNOSIS — Z79899 Other long term (current) drug therapy: Secondary | ICD-10-CM | POA: Diagnosis not present

## 2024-01-08 DIAGNOSIS — F1190 Opioid use, unspecified, uncomplicated: Secondary | ICD-10-CM | POA: Diagnosis not present

## 2024-01-09 DIAGNOSIS — C61 Malignant neoplasm of prostate: Secondary | ICD-10-CM | POA: Diagnosis not present

## 2024-01-09 DIAGNOSIS — Z79899 Other long term (current) drug therapy: Secondary | ICD-10-CM | POA: Diagnosis not present

## 2024-01-15 DIAGNOSIS — Z1331 Encounter for screening for depression: Secondary | ICD-10-CM | POA: Diagnosis not present

## 2024-01-15 DIAGNOSIS — Z79899 Other long term (current) drug therapy: Secondary | ICD-10-CM | POA: Diagnosis not present

## 2024-01-15 DIAGNOSIS — Z0000 Encounter for general adult medical examination without abnormal findings: Secondary | ICD-10-CM | POA: Diagnosis not present

## 2024-01-15 DIAGNOSIS — F32A Depression, unspecified: Secondary | ICD-10-CM | POA: Diagnosis not present

## 2024-01-15 DIAGNOSIS — Z125 Encounter for screening for malignant neoplasm of prostate: Secondary | ICD-10-CM | POA: Diagnosis not present

## 2024-01-15 DIAGNOSIS — M5136 Other intervertebral disc degeneration, lumbar region with discogenic back pain only: Secondary | ICD-10-CM | POA: Diagnosis not present

## 2024-01-15 DIAGNOSIS — J449 Chronic obstructive pulmonary disease, unspecified: Secondary | ICD-10-CM | POA: Diagnosis not present

## 2024-06-11 DIAGNOSIS — Z23 Encounter for immunization: Secondary | ICD-10-CM | POA: Diagnosis not present

## 2024-06-11 DIAGNOSIS — R21 Rash and other nonspecific skin eruption: Secondary | ICD-10-CM | POA: Diagnosis not present

## 2024-07-22 ENCOUNTER — Other Ambulatory Visit: Payer: Self-pay | Admitting: Internal Medicine

## 2024-07-22 DIAGNOSIS — R918 Other nonspecific abnormal finding of lung field: Secondary | ICD-10-CM

## 2024-07-22 DIAGNOSIS — Z0000 Encounter for general adult medical examination without abnormal findings: Secondary | ICD-10-CM
